# Patient Record
Sex: Male | Born: 1960 | Race: White | Hispanic: No | Marital: Married | Smoking: Never smoker
Health system: Southern US, Community
[De-identification: ages and names within clinical notes are randomized; demographics above are authoritative.]

## PROBLEM LIST (undated history)

## (undated) DIAGNOSIS — D649 Anemia, unspecified: Secondary | ICD-10-CM

## (undated) DIAGNOSIS — G8929 Other chronic pain: Secondary | ICD-10-CM

## (undated) DIAGNOSIS — J189 Pneumonia, unspecified organism: Secondary | ICD-10-CM

## (undated) DIAGNOSIS — T7840XA Allergy, unspecified, initial encounter: Secondary | ICD-10-CM

## (undated) DIAGNOSIS — E785 Hyperlipidemia, unspecified: Secondary | ICD-10-CM

## (undated) DIAGNOSIS — K219 Gastro-esophageal reflux disease without esophagitis: Secondary | ICD-10-CM

## (undated) HISTORY — DX: Pneumonia, unspecified organism: J18.9

## (undated) HISTORY — DX: Allergy, unspecified, initial encounter: T78.40XA

## (undated) HISTORY — DX: Gastro-esophageal reflux disease without esophagitis: K21.9

## (undated) HISTORY — DX: Hyperlipidemia, unspecified: E78.5

## (undated) HISTORY — DX: Anemia, unspecified: D64.9

## (undated) HISTORY — DX: Other chronic pain: G89.29

## (undated) HISTORY — PX: EYE SURGERY: SHX253

---

## 1993-10-14 HISTORY — PX: SHOULDER SURGERY: SHX246

## 2008-10-14 HISTORY — PX: APPENDECTOMY: SHX54

## 2010-10-14 DIAGNOSIS — J189 Pneumonia, unspecified organism: Secondary | ICD-10-CM

## 2010-10-14 HISTORY — DX: Pneumonia, unspecified organism: J18.9

## 2012-10-12 ENCOUNTER — Ambulatory Visit: Payer: Self-pay | Admitting: Family Medicine

## 2012-10-21 ENCOUNTER — Encounter: Payer: Self-pay | Admitting: Family Medicine

## 2012-10-21 ENCOUNTER — Ambulatory Visit (INDEPENDENT_AMBULATORY_CARE_PROVIDER_SITE_OTHER): Payer: 59 | Admitting: Family Medicine

## 2012-10-21 VITALS — BP 118/82 | HR 72 | Temp 98.1°F | Ht 65.0 in | Wt 162.2 lb

## 2012-10-21 DIAGNOSIS — G2581 Restless legs syndrome: Secondary | ICD-10-CM | POA: Insufficient documentation

## 2012-10-21 DIAGNOSIS — A6000 Herpesviral infection of urogenital system, unspecified: Secondary | ICD-10-CM | POA: Insufficient documentation

## 2012-10-21 DIAGNOSIS — M2550 Pain in unspecified joint: Secondary | ICD-10-CM

## 2012-10-21 DIAGNOSIS — R140 Abdominal distension (gaseous): Secondary | ICD-10-CM | POA: Insufficient documentation

## 2012-10-21 DIAGNOSIS — D509 Iron deficiency anemia, unspecified: Secondary | ICD-10-CM

## 2012-10-21 DIAGNOSIS — R143 Flatulence: Secondary | ICD-10-CM

## 2012-10-21 LAB — CBC WITH DIFFERENTIAL/PLATELET
Eosinophils Absolute: 0.1 10*3/uL (ref 0.0–0.7)
Eosinophils Relative: 2 % (ref 0–5)
Hemoglobin: 15.4 g/dL (ref 13.0–17.0)
Lymphs Abs: 1.6 10*3/uL (ref 0.7–4.0)
MCH: 27.4 pg (ref 26.0–34.0)
MCV: 80.1 fL (ref 78.0–100.0)
Monocytes Relative: 10 % (ref 3–12)
RBC: 5.62 MIL/uL (ref 4.22–5.81)

## 2012-10-21 LAB — VITAMIN B12: Vitamin B-12: 345 pg/mL (ref 211–911)

## 2012-10-21 LAB — COMPREHENSIVE METABOLIC PANEL
CO2: 25 mEq/L (ref 19–32)
Creat: 0.91 mg/dL (ref 0.50–1.35)
Glucose, Bld: 99 mg/dL (ref 70–99)
Sodium: 139 mEq/L (ref 135–145)
Total Bilirubin: 0.4 mg/dL (ref 0.3–1.2)
Total Protein: 6.8 g/dL (ref 6.0–8.3)

## 2012-10-21 LAB — POCT SEDIMENTATION RATE: POCT SED RATE: 5 mm/hr (ref 0–22)

## 2012-10-21 LAB — TSH: TSH: 2.131 u[IU]/mL (ref 0.350–4.500)

## 2012-10-21 MED ORDER — CITALOPRAM HYDROBROMIDE 40 MG PO TABS: 40.0000 mg | ORAL_TABLET | Freq: Every day | ORAL | Status: DC

## 2012-10-21 MED ORDER — VALACYCLOVIR HCL 500 MG PO TABS: 500.0000 mg | ORAL_TABLET | Freq: Two times a day (BID) | ORAL | Status: DC | PRN

## 2012-10-21 MED ORDER — PANTOPRAZOLE SODIUM 40 MG PO TBEC: 40.0000 mg | DELAYED_RELEASE_TABLET | Freq: Every day | ORAL | Status: DC | PRN

## 2012-10-21 NOTE — Assessment & Plan Note (Signed)
Will recheck CBC. Reports negative hemoccult done at outside facility and negative colonoscopy. He has a family hx of colon cancer in mother. If abdominal symptoms persist with microcytic anemia, may refer to GI for repeat colonoscopy or EGD.

## 2012-10-21 NOTE — Progress Notes (Signed)
Subjective:    Patient ID: Perry Rodriguez, male    DOB: August 10, 1961, 52 y.o.   MRN: [id number]  HPI New patient to establish care. Previous at [facility], recently moved from [facility] (there for 8 yrs)  1. Anemia. States was found on lab testing, mild. Brings report showing microcytic 73 with hgb 11.7. He underwent stool testing negative, and was told to start iron supplements. He stopped this 2 weeks ago, seems upset regarding not getting additional testing done.  Reports he had a normal colonoscopy in [place] in 2011, never had EGD. ROS complains of myalgias, hip/elbow/wrist pain bilaterally, fatigue, abdominal pain and bloating x 3 months.  2. Restless leg syndrome. Diagnosed with sleep study x 2. He doesn't sleep well and feels tired frequently. Has been taking clonazepam for many years and celexa. Requests refills. Denies dyspnea, cough, leg swelling, falls, numbness, rash.  Past Medical History  Diagnosis Date  . Allergy   . Anemia   . Chronic pain   . GERD (gastroesophageal reflux disease)   . Hyperlipidemia    Past Surgical History  Procedure Date  . Appendectomy 2010  . Shoulder surgery 1995    rotator cuff  . Eye surgery     cataract   History   Social History  . Marital Status: Married    Spouse Name: N/A    Number of Children: N/A  . Years of Education: N/A   Occupational History  . Not on file.   Social History Main Topics  . Smoking status: Never Smoker   . Smokeless tobacco: Not on file  . Alcohol Use: Yes     Comment: 2 daily  . Drug Use: No  . Sexually Active:    Other Topics Concern  . Not on file   Social History Narrative   Works in office. Married 28 yrs. One son. Walks for exercise.   Family History  Problem Relation Age of Onset  . Cancer Mother     cancer  . Heart disease Father   . Hyperlipidemia Father   . Hypertension Father   . Hyperlipidemia Brother   . Alcohol abuse Paternal Uncle   . Alcohol abuse Paternal Grandfather      Review of Systems All other ROS negative except for chronic sight and hearing issues, hoarseness. Patient states his most concerning issues are listed above and he agrees to defer these until later date.    Objective:   Physical Exam  Vitals reviewed. Constitutional: He is oriented to person, place, and time. He appears well-developed and well-nourished. No distress.       Well-appearing.  HENT:  Head: Normocephalic and atraumatic.  Right Ear: External ear normal.  Left Ear: External ear normal.  Nose: Nose normal.  Mouth/Throat: Oropharynx is clear and moist. No oropharyngeal exudate.       No sinus TTP  Eyes: EOM are normal. Pupils are equal, round, and reactive to light.  Neck: Normal range of motion. Neck supple. No thyromegaly present.  Cardiovascular: Normal rate, regular rhythm and normal heart sounds.   No murmur heard. Pulmonary/Chest: Effort normal and breath sounds normal. No respiratory distress. He has no wheezes. He has no rales.  Abdominal: Soft. Bowel sounds are normal. He exhibits no distension. There is no tenderness. There is no rebound and no guarding.  Musculoskeletal: He exhibits no edema and no tenderness.  Lymphadenopathy:    He has no cervical adenopathy.  Neurological: He is alert and oriented to person, place, and time.  No cranial nerve deficit. He exhibits normal muscle tone. Coordination normal.  Skin: No rash noted. He is not diaphoretic.  Psychiatric: He has a normal mood and affect. His behavior is normal. Thought content normal.       Assessment & Plan:

## 2012-10-21 NOTE — Patient Instructions (Addendum)
Nice to meet you. I will send refills. Check labs. We can discuss at next visit the results. Make appointment for clonazepam refills and to discuss results.

## 2012-10-21 NOTE — Assessment & Plan Note (Signed)
Refill to use prn. Consider change to daily prophylactic dose if recurrent flares.

## 2012-10-21 NOTE — Assessment & Plan Note (Signed)
Refill celexa. Advised pt to return for clonopin, no controlled substances at first office visit.

## 2012-10-21 NOTE — Assessment & Plan Note (Signed)
Exam appears benign today and he is overall well-appearing and young. With symmetric and polyarticular complaints with myalgias and fatigue, will check labs, ESR to eval for inflammatory condition. If negative, this may represent more of a fibromyalgia or chronic pain syndrome. F/u in 2 weeks.

## 2012-10-22 ENCOUNTER — Encounter: Payer: Self-pay | Admitting: Family Medicine

## 2012-11-04 ENCOUNTER — Ambulatory Visit (INDEPENDENT_AMBULATORY_CARE_PROVIDER_SITE_OTHER): Payer: 59 | Admitting: Family Medicine

## 2012-11-04 ENCOUNTER — Encounter: Payer: Self-pay | Admitting: Family Medicine

## 2012-11-04 VITALS — BP 118/73 | HR 69 | Ht 65.0 in | Wt 160.0 lb

## 2012-11-04 DIAGNOSIS — G2581 Restless legs syndrome: Secondary | ICD-10-CM

## 2012-11-04 DIAGNOSIS — N529 Male erectile dysfunction, unspecified: Secondary | ICD-10-CM | POA: Insufficient documentation

## 2012-11-04 DIAGNOSIS — R5381 Other malaise: Secondary | ICD-10-CM

## 2012-11-04 DIAGNOSIS — D509 Iron deficiency anemia, unspecified: Secondary | ICD-10-CM

## 2012-11-04 DIAGNOSIS — R5383 Other fatigue: Secondary | ICD-10-CM | POA: Insufficient documentation

## 2012-11-04 MED ORDER — CLONAZEPAM 0.5 MG PO TABS: 0.5000 mg | ORAL_TABLET | Freq: Two times a day (BID) | ORAL | Status: DC | PRN

## 2012-11-04 MED ORDER — SILDENAFIL CITRATE 100 MG PO TABS: 50.0000 mg | ORAL_TABLET | Freq: Every day | ORAL | Status: DC | PRN

## 2012-11-04 NOTE — Assessment & Plan Note (Signed)
Suspect chronic fatigue syndrome or result of restless legs/disordered sleeping. Labs normal including CBC, TSH, ESR. Normal colonoscopy reported. Advised lifestyle changes-avoiding alcohol, increased regular exercise for better sleep hygeine. Discussed trying to go off clonopin which can disrupt later phases of sleep, but he is not willing because has shown to make restless legs unbearable. Advised to f/u in 6 months for repeat CBC.

## 2012-11-04 NOTE — Assessment & Plan Note (Signed)
Seems controlled on his regimen celexa and klonopin. Refilled this today, discussed trial off benzo, but he has failed requip and other drugs previously. F/u in 6 months.

## 2012-11-04 NOTE — Assessment & Plan Note (Signed)
By the way request to try viagra, discussed possible side effects and to start with low dose.

## 2012-11-04 NOTE — Progress Notes (Signed)
  Subjective:    Patient ID: Perry Rodriguez, male    DOB: [birth year], 52 y.o.   MRN: [id number]  HPI  1. F/u fatigue. Reviewed labs wnl. He describes fatigue upon awakening in the morning, needs to take a one hour nap then feels fine after that. Has restless leg syndrome and 2 sleep studies from OSH negative for OSA. He snores and uses mandibular advancement device. He does drink 2-3 alcoholic drinks nightly, and is dependent on clonopin for RLS. He doesn't exercise regularly. Denies dyspnea, cough, weight loss, rash, blood in stool or urine.  2. History of anemia. Blood counts wnl currently. He had a borderline low hemoglobin with negative colonoscopy and negative hemacult before coming to clinic. He has not been taking iron, denies palpitations, syncope, gross blood loss.  3. Restless leg syndrome. Needs refill clonopin, been on this for 10 years. Has failed requip and 2 other medications before he moved here.   Review of Systems See HPI otherwise negative.  reports that he has never smoked. He does not have any smokeless tobacco history on file.     Objective:   Physical Exam  Vitals reviewed. Constitutional: He is oriented to person, place, and time. He appears well-developed and well-nourished. No distress.  HENT:  Head: Normocephalic and atraumatic.  Right Ear: External ear normal.  Left Ear: External ear normal.  Nose: Nose normal.  Mouth/Throat: Oropharynx is clear and moist. No oropharyngeal exudate.  Eyes: EOM are normal. Pupils are equal, round, and reactive to light.  Cardiovascular: Normal rate, regular rhythm and normal heart sounds.   No murmur heard. Pulmonary/Chest: Effort normal and breath sounds normal. No respiratory distress. He has no wheezes.  Neurological: He is alert and oriented to person, place, and time. No cranial nerve deficit. He exhibits normal muscle tone. Coordination normal.  Psychiatric: He has a normal mood and affect. His behavior is normal.           Assessment & Plan:

## 2012-11-04 NOTE — Assessment & Plan Note (Signed)
Resolved and without iron currently. Negative stool guiac, negative colonoscopy. Advised to f/u in 6 months for repeat check  And consider EGD if returns.

## 2012-11-04 NOTE — Patient Instructions (Signed)
Your labs look reassuring for nothing bad causing your fatigue. Suspect it has to do with disordered sleeping pattern. You could try avoiding alcohol and doing physical exertion before 7:00 daily. I have refilled klonopin. Make appointment for check up in 6 months for blood checks.

## 2012-11-17 ENCOUNTER — Telehealth: Payer: Self-pay | Admitting: Family Medicine

## 2012-11-17 DIAGNOSIS — E611 Iron deficiency: Secondary | ICD-10-CM

## 2012-11-17 NOTE — Telephone Encounter (Signed)
Dr Konkol 

## 2012-11-17 NOTE — Telephone Encounter (Signed)
Dr Newman Nip would like an Iron panel lab drawn to compare to her September results that were done in a different Dr's office.She states she has given [place] a copy of the September results and should be in her chart.please advise. Perry Rodriguez, Perry Rodriguez

## 2012-11-17 NOTE — Telephone Encounter (Signed)
Pt is asking to speak to nurse about his lab work - has questions in relation to some previous labs compared to this recent lab work.

## 2012-11-20 NOTE — Telephone Encounter (Signed)
Pt would like to know something today, please

## 2012-11-20 NOTE — Telephone Encounter (Signed)
Called patient back. Iron levels not indicated with normal hemoglobin, but he is curious. I agree to order this and he can make lab appointment.

## 2012-11-27 ENCOUNTER — Other Ambulatory Visit: Payer: 59

## 2012-12-01 ENCOUNTER — Other Ambulatory Visit: Payer: 59

## 2012-12-01 DIAGNOSIS — E611 Iron deficiency: Secondary | ICD-10-CM

## 2012-12-01 LAB — FERRITIN: Ferritin: 27 ng/mL (ref 22–322)

## 2012-12-01 NOTE — Progress Notes (Signed)
IBC AND FERRITIN DONE TODAY Perry Rodriguez

## 2012-12-02 ENCOUNTER — Telehealth: Payer: Self-pay | Admitting: Family Medicine

## 2012-12-02 ENCOUNTER — Encounter: Payer: Self-pay | Admitting: Family Medicine

## 2012-12-02 NOTE — Telephone Encounter (Signed)
Patient is calling because his upper abdomen going around to his back has been going on for a few weeks.  He would like to know if she has any recommendations.

## 2012-12-25 ENCOUNTER — Other Ambulatory Visit: Payer: Self-pay | Admitting: Family Medicine

## 2012-12-28 NOTE — Telephone Encounter (Signed)
rx caled in,spoke with Hospital doctor at [facility]. Perry Rodriguez, Perry Rodriguez

## 2013-02-26 ENCOUNTER — Other Ambulatory Visit: Payer: Self-pay | Admitting: Family Medicine

## 2013-03-01 ENCOUNTER — Other Ambulatory Visit: Payer: Self-pay | Admitting: Family Medicine

## 2013-03-02 NOTE — Telephone Encounter (Signed)
Rx for Klonopin called into [facility] per MD request.  Radene Ou

## 2013-03-12 ENCOUNTER — Ambulatory Visit: Payer: 59 | Admitting: Family Medicine

## 2013-04-20 ENCOUNTER — Ambulatory Visit (INDEPENDENT_AMBULATORY_CARE_PROVIDER_SITE_OTHER): Payer: 59 | Admitting: Family Medicine

## 2013-04-20 ENCOUNTER — Encounter: Payer: Self-pay | Admitting: Family Medicine

## 2013-04-20 VITALS — BP 108/80 | HR 66 | Temp 98.4°F | Ht 65.0 in | Wt 156.0 lb

## 2013-04-20 DIAGNOSIS — G2581 Restless legs syndrome: Secondary | ICD-10-CM

## 2013-04-20 DIAGNOSIS — D509 Iron deficiency anemia, unspecified: Secondary | ICD-10-CM

## 2013-04-20 DIAGNOSIS — R5383 Other fatigue: Secondary | ICD-10-CM

## 2013-04-20 DIAGNOSIS — N529 Male erectile dysfunction, unspecified: Secondary | ICD-10-CM

## 2013-04-20 LAB — CBC WITH DIFFERENTIAL/PLATELET
Basophils Relative: 0.7 % (ref 0.0–3.0)
Eosinophils Absolute: 0.3 10*3/uL (ref 0.0–0.7)
HCT: 42.8 % (ref 39.0–52.0)
Hemoglobin: 14.7 g/dL (ref 13.0–17.0)
Lymphocytes Relative: 30.8 % (ref 12.0–46.0)
Lymphs Abs: 1.5 10*3/uL (ref 0.7–4.0)
MCHC: 34.3 g/dL (ref 30.0–36.0)
MCV: 90.5 fl (ref 78.0–100.0)
Monocytes Absolute: 0.4 10*3/uL (ref 0.1–1.0)
Neutro Abs: 2.5 10*3/uL (ref 1.4–7.7)
RBC: 4.73 Mil/uL (ref 4.22–5.81)

## 2013-04-20 LAB — BASIC METABOLIC PANEL
BUN: 10 mg/dL (ref 6–23)
Chloride: 105 mEq/L (ref 96–112)
Creatinine, Ser: 1 mg/dL (ref 0.4–1.5)

## 2013-04-20 LAB — TSH: TSH: 2.3 u[IU]/mL (ref 0.35–5.50)

## 2013-04-20 MED ORDER — DULOXETINE HCL 30 MG PO CPEP: 30.0000 mg | ORAL_CAPSULE | Freq: Every day | ORAL | Status: DC

## 2013-04-20 MED ORDER — TADALAFIL 20 MG PO TABS: 20.0000 mg | ORAL_TABLET | Freq: Every day | ORAL | Status: AC | PRN

## 2013-04-20 NOTE — Assessment & Plan Note (Signed)
New to provider, ongoing for pt.  sxs manageable w/ klonopin.  Will continue to follow and refill meds prn.

## 2013-04-20 NOTE — Assessment & Plan Note (Signed)
New to provider, dx'd Sept 2013.  Pt was taking iron regularly up until 2 weeks ago.  Still w/ fatigue but less so than previously.  Repeat labs to determine if pt remains anemic.  If so, will refer to hematology due to Mediterranean heritage and possible thalassemia.  Pt expressed understanding and is in agreement w/ plan.

## 2013-04-20 NOTE — Patient Instructions (Addendum)
We'll notify you of your lab results and make any changes if needed STOP the citalopram START the Cymbalta daily Call with any questions or concerns Happy Early Birthday!! Welcome!  We're glad to have you!

## 2013-04-20 NOTE — Progress Notes (Signed)
  Subjective:    Patient ID: Perry Rodriguez, male    DOB: 1961-07-29, 52 y.o.   MRN: [id number]  HPI New to establish.  Previous MD- Dalia Heading  Anemia- pt w/ hgb of 11.7 in Sept.  Started on iron and by Jan hgb was 15.4.  Iron levels had also improved.  Work up was initiated due to pt's complaint of fatigue.  Had normal thyroid studies in both Sept and Jan.  Not currently on iron (stopped June 30th).  Had normal GI w/u w/ normal colonoscopy.  Fatigue- pt reports this has somewhat improved since the fall.  Sleeping better.  On Klonopin for severe RLS.  Has been on Mirapex and Requip w/out success.  Had 2 sleep studies- wearing a mouth piece nightly.  + ED, decreased drive.  Decreased stamina w/ SOB- had pulm work up previously.  Not exercising regularly.  Anxiety/depression- has been on med for 'irritability' for '20 yrs'.  Currently on Celexa.  Herpes- has had 4-5 outbreaks in last 6 months where previously this was ~1x/yr.  Currently on Valtrex PRN.   Review of Systems For ROS see HPI     Objective:   Physical Exam  Vitals reviewed. Constitutional: He is oriented to person, place, and time. He appears well-developed and well-nourished. No distress.  HENT:  Head: Normocephalic and atraumatic.  Eyes: Conjunctivae and EOM are normal. Pupils are equal, round, and reactive to light.  Neck: Normal range of motion. Neck supple. No thyromegaly present.  Cardiovascular: Normal rate, regular rhythm, normal heart sounds and intact distal pulses.   No murmur heard. Pulmonary/Chest: Effort normal and breath sounds normal. No respiratory distress.  Abdominal: Soft. Bowel sounds are normal. He exhibits no distension.  Musculoskeletal: He exhibits no edema.  Lymphadenopathy:    He has no cervical adenopathy.  Neurological: He is alert and oriented to person, place, and time. No cranial nerve deficit.  Skin: Skin is warm and dry.  Psychiatric: He has a normal mood and affect. His behavior is  normal.          Assessment & Plan:

## 2013-04-20 NOTE — Assessment & Plan Note (Signed)
New to provider, ongoing for pt.  sxs are not as severe as previous.  Will check labs to r/o anemia, thyroid disorder, low T (pt also w/ ED and decreased libido).  Will also switch SSRI to SNRI due to fact that this is more activating.  Will follow and monitor for improvement.

## 2013-04-20 NOTE — Assessment & Plan Note (Signed)
Check testosterone level and script given for Cialis.

## 2013-04-21 ENCOUNTER — Encounter: Payer: Self-pay | Admitting: *Deleted

## 2013-04-21 LAB — TESTOSTERONE, FREE, TOTAL, SHBG
Testosterone-% Free: 1.8 % (ref 1.6–2.9)
Testosterone: 636 ng/dL (ref 300–890)

## 2013-05-13 ENCOUNTER — Telehealth: Payer: Self-pay | Admitting: Family Medicine

## 2013-05-13 DIAGNOSIS — R5381 Other malaise: Secondary | ICD-10-CM

## 2013-05-13 NOTE — Telephone Encounter (Signed)
Patient left vm on triage line stating he has questions regarding his last visit with Dr. Beverely Low. He was seen for several different issues. CB# [phone]

## 2013-05-14 NOTE — Telephone Encounter (Signed)
Patient states that it is mostly fatigue. Has body aches but not as worried about that since he feels it could be from normal moving around.

## 2013-05-14 NOTE — Telephone Encounter (Signed)
Referral placed. Advised patient.

## 2013-05-14 NOTE — Telephone Encounter (Signed)
I returned patient's call and he states that he is still feeling tired and he is not getting any better. Please Advise

## 2013-05-14 NOTE — Telephone Encounter (Signed)
Is pt having any other sxs other than fatigue?  Body aches, joint pains, etc.  He will need a referral but I'm trying to determine if it will be Rheum or Endo

## 2013-05-14 NOTE — Telephone Encounter (Signed)
Please refer to endo for chronic fatigue

## 2013-05-19 ENCOUNTER — Ambulatory Visit (INDEPENDENT_AMBULATORY_CARE_PROVIDER_SITE_OTHER): Payer: 59 | Admitting: Endocrinology

## 2013-05-19 ENCOUNTER — Encounter: Payer: Self-pay | Admitting: Endocrinology

## 2013-05-19 VITALS — BP 124/64 | HR 61 | Temp 97.6°F | Resp 12 | Ht 65.0 in | Wt 156.4 lb

## 2013-05-19 DIAGNOSIS — R0989 Other specified symptoms and signs involving the circulatory and respiratory systems: Secondary | ICD-10-CM

## 2013-05-19 DIAGNOSIS — R5381 Other malaise: Secondary | ICD-10-CM

## 2013-05-19 DIAGNOSIS — G4710 Hypersomnia, unspecified: Secondary | ICD-10-CM

## 2013-05-19 DIAGNOSIS — R5383 Other fatigue: Secondary | ICD-10-CM

## 2013-05-19 DIAGNOSIS — R400 Somnolence: Secondary | ICD-10-CM

## 2013-05-19 LAB — T4, FREE: Free T4: 0.69 ng/dL (ref 0.60–1.60)

## 2013-05-19 NOTE — Progress Notes (Signed)
Patient ID: Perry Rodriguez, male   DOB: 1961/01/02, 52 y.o.   MRN: [id number]   REASON for REFERRAL: Fatigue  History of Present Illness:  The patient has a history of fatigue off and on for the last several years  He says that symptom does not occur everyday but he gets significantly fatigued and sleepy during the day. This may occur sometimes even after a full nights sleep Associated with this fatigue is feeling of not being able to think clearly or concentrate. At that time he feels his extremities are as heavy as lead When he has the symptoms he will feel bad the whole day He may occasionally need a nap about twice a week because of these symptoms He does not think he has any lightheadedness or faintness with these symptoms He does exercise periodically and he finds that his muscle strength is good. He generally walks 1 mile without problem  He denies any symptoms of cold intolerance, dry skin or constipation  The patient has had a previous history of sleep disorder for about 10 years. He has been studied twice and has been found to have poor sleep along with restless legs He has a history of snoring but he was told that he has no sleep apnea  The patient has had thyroid levels done and have been normal and he is here for further evaluation  Past Medical History  Diagnosis Date  . Allergy   . Anemia   . Chronic pain   . GERD (gastroesophageal reflux disease)   . Hyperlipidemia     Past Surgical History  Procedure Laterality Date  . Appendectomy  2010  . Shoulder surgery  1995    rotator cuff  . Eye surgery      cataract    Family History  Problem Relation Age of Onset  . Cancer Mother     cancer  . Heart disease Father   . Hyperlipidemia Father   . Hypertension Father   . Hyperlipidemia Brother   . Alcohol abuse Paternal Uncle   . Alcohol abuse Paternal Grandfather     Social History:  reports that he has never smoked. He does not have any smokeless tobacco  history on file. He reports that  drinks alcohol. He reports that he does not use illicit drugs.  Allergies: No Known Allergies    Medication List       This list is accurate as of: 05/19/13  1:12 PM.  Always use your most recent med list.               clonazePAM 0.5 MG tablet  Commonly known as:  KLONOPIN  TAKE 1 TABLET BY MOUTH TWICE DAILY AS NEEDED FOR RESTLESS LEGS     DULoxetine 30 MG capsule  Commonly known as:  CYMBALTA  Take 1 capsule (30 mg total) by mouth daily.     pantoprazole 40 MG tablet  Commonly known as:  PROTONIX  Take 1 tablet (40 mg total) by mouth daily as needed.     SLOW RELEASE IRON 45 MG Tbcr  Generic drug:  Ferrous Sulfate Dried  Take 1 tablet by mouth daily.     tadalafil 20 MG tablet  Commonly known as:  CIALIS  Take 1 tablet (20 mg total) by mouth daily as needed for erectile dysfunction.     valACYclovir 500 MG tablet  Commonly known as:  VALTREX  Take 1 tablet (500 mg total) by mouth 2 (two) times daily as needed.  No visits with results within 1 Week(s) from this visit. Latest known visit with results is:  Office Visit on 04/20/2013  Component Date Value Range Status  . WBC 04/20/2013 4.7  4.5 - 10.5 K/uL Final  . RBC 04/20/2013 4.73  4.22 - 5.81 Mil/uL Final  . Hemoglobin 04/20/2013 14.7  13.0 - 17.0 g/dL Final  . HCT 16/07/9603 42.8  39.0 - 52.0 % Final  . MCV 04/20/2013 90.5  78.0 - 100.0 fl Final  . MCHC 04/20/2013 34.3  30.0 - 36.0 g/dL Final  . RDW 54/06/8118 13.5  11.5 - 14.6 % Final  . Platelets 04/20/2013 232.0  150.0 - 400.0 K/uL Final  . Neutrophils Relative % 04/20/2013 52.9  43.0 - 77.0 % Final  . Lymphocytes Relative 04/20/2013 30.8  12.0 - 46.0 % Final  . Monocytes Relative 04/20/2013 8.6  3.0 - 12.0 % Final  . Eosinophils Relative 04/20/2013 7.0* 0.0 - 5.0 % Final  . Basophils Relative 04/20/2013 0.7  0.0 - 3.0 % Final  . Neutro Abs 04/20/2013 2.5  1.4 - 7.7 K/uL Final  . Lymphs Abs 04/20/2013 1.5  0.7 -  4.0 K/uL Final  . Monocytes Absolute 04/20/2013 0.4  0.1 - 1.0 K/uL Final  . Eosinophils Absolute 04/20/2013 0.3  0.0 - 0.7 K/uL Final  . Basophils Absolute 04/20/2013 0.0  0.0 - 0.1 K/uL Final  . Sodium 04/20/2013 137  135 - 145 mEq/L Final  . Potassium 04/20/2013 4.2  3.5 - 5.1 mEq/L Final  . Chloride 04/20/2013 105  96 - 112 mEq/L Final  . CO2 04/20/2013 30  19 - 32 mEq/L Final  . Glucose, Bld 04/20/2013 97  70 - 99 mg/dL Final  . BUN 14/78/2956 10  6 - 23 mg/dL Final  . Creatinine, Ser 04/20/2013 1.0  0.4 - 1.5 mg/dL Final  . Calcium 21/30/8657 8.8  8.4 - 10.5 mg/dL Final  . GFR 84/69/6295 87.12  >60.00 mL/min Final  . TSH 04/20/2013 2.30  0.35 - 5.50 uIU/mL Final  . Iron 04/20/2013 62  42 - 165 ug/dL Final  . Transferrin 28/41/3244 303.3  212.0 - 360.0 mg/dL Final  . Saturation Ratios 04/20/2013 14.6* 20.0 - 50.0 % Final  . Testosterone 04/20/2013 636  300 - 890 ng/dL Final   Comment:           Tanner Stage       Male              Male                                        I              < 30 ng/dL        < 10 ng/dL                                        II             < 150 ng/dL       < 30 ng/dL                                        III  100-320 ng/dL     < 35 ng/dL                                        IV             200-970 ng/dL     16-10 ng/dL                                        V/Adult        300-890 ng/dL     96-04 ng/dL                             . Sex Hormone Binding 04/20/2013 44  13 - 71 nmol/L Final  . Testosterone, Free 04/20/2013 115.9  47.0 - 244.0 pg/mL Final   Comment:                            The concentration of free testosterone is derived from a mathematical                          expression based on constants for the binding of testosterone to sex                          hormone-binding globulin and albumin.  . Testosterone-% Freee. 04/20/2013 1.8  1.6 - 2.9 % Final     REVIEW OF SYSTEMS:           No unusual headaches.     He  has not had any significant weight change, he has normal appetite.       Skin: No rash or infections     Thyroid:  He has had long-standing sensitivity to cold which is not any worse     His blood pressure has been normal.     No swelling of feet.    He may get short of breath on moderate exertion     Bowel habits:  No change. He does have a history of gastroesophageal reflux         No frequency of urination       He. had mild joint pains which are generalized and has a feeling of stiffness especially when he is sitting for sometime       He has had a history of depression  and anxiety. He was started on Celexa when he was having symptoms of irritability and moodiness   PHYSICAL EXAM:  BP 124/64  Pulse 61  Temp(Src) 97.6 F (36.4 C)  Resp 12  Ht 5\' 5"  (1.651 m)  Wt 156 lb 6.4 oz (70.943 kg)  BMI 26.03 kg/m2  SpO2 98%  GENERAL:  Average built and nourished. Normal facial appearance  No pallor, clubbing, lymphadenopathy or edema.  Skin:  no rash or pigmentation.  EYES:  Externally normal.   ENT: Oral mucosa and tongue normal.  THYROID:  Not palpable.  HEART:  Normal  S1 and S2; no murmur or click.  CHEST:  Normal shape and expansion.  Lungs:  Percussion normal.  Vescicular breath sounds heard equally.  No crepitations/ wheeze.  ABDOMEN:  No distention.  Liver and spleen  not palpable.  No other mass or tenderness.  NEUROLOGICAL:.Reflexes are  normal bilaterally at  biceps.   JOINTS:  Normal appearance.   ASSESSMENT:   FATIGUE: He has intermittent fatigue and this is more in the form of somnolence at times and lack of energy He does have known sleep disorder with restless legs and frequent awakenings and this is likely to be the cause of his fatigue He has had normal labs recently including testosterone levels and TSH  Overall his symptoms do not fit with any typical endocrine deficiency disorder but will need to rule out hypopituitarism especially secondary  hypothyroidism since he has some difficulty concentration  and cold intolerance  He does have erectile dysfunction of unclear etiology but his testosterone level is quite normal He does have history of anxiety/mood disorder but this appears to be fairly well controlled  He has no evidence of Addison's disease with no typical symptoms, orthostatic hypotension or electrolyte abnormality  PLAN:   Will check free T4 and IGF-1 level today  If the tests are normal would suggest that he see a sleep disorder specialist for further management  He can also discuss his symptom of dyspnea on exertion with PCP  [facility] 05/19/2013, 1:12 PM   Addendum: Free T4 and IGF-1 normal

## 2013-05-19 NOTE — Patient Instructions (Addendum)
Your labs wil be available in 3-4 days

## 2013-05-20 LAB — INSULIN-LIKE GROWTH FACTOR: Somatomedin (IGF-I): 116 ng/mL (ref 55–213)

## 2013-05-21 NOTE — Progress Notes (Signed)
Quick Note:  Please let patient know that the lab results are normal and no further action needed ______ 

## 2013-05-24 ENCOUNTER — Telehealth: Payer: Self-pay | Admitting: *Deleted

## 2013-05-24 NOTE — Telephone Encounter (Signed)
Message copied by Hermenia Bers on Mon May 24, 2013  3:54 PM ------      Message from: Perry Rodriguez      Created: Fri May 21, 2013  9:30 AM       Please let patient know that the lab results are normal and no further action needed ------

## 2013-05-24 NOTE — Telephone Encounter (Signed)
Left message for patient to return call.

## 2013-05-25 ENCOUNTER — Telehealth: Payer: Self-pay | Admitting: *Deleted

## 2013-05-25 NOTE — Telephone Encounter (Signed)
Pt is aware of lab results.

## 2013-05-25 NOTE — Telephone Encounter (Signed)
Message copied by Hermenia Bers on Tue May 25, 2013  2:04 PM ------      Message from: Reather Littler      Created: Fri May 21, 2013  9:30 AM       Please let patient know that the lab results are normal and no further action needed ------

## 2013-06-07 ENCOUNTER — Other Ambulatory Visit: Payer: Self-pay | Admitting: Family Medicine

## 2013-06-09 ENCOUNTER — Other Ambulatory Visit: Payer: Self-pay | Admitting: Family Medicine

## 2013-06-09 NOTE — Telephone Encounter (Signed)
No UDS on file.  Last filled on 03/01/2013 Last OV on 04/20/2013  Please advise. SW, CMA

## 2013-06-10 NOTE — Telephone Encounter (Signed)
Ok for #60 but needs controlled substance agreement and UDS if not done

## 2013-06-11 NOTE — Telephone Encounter (Signed)
Med filled. Placed up front for pick up, called and notified pt. Needs UDS.

## 2013-06-29 ENCOUNTER — Telehealth: Payer: Self-pay | Admitting: General Practice

## 2013-06-29 ENCOUNTER — Other Ambulatory Visit: Payer: Self-pay | Admitting: General Practice

## 2013-06-29 MED ORDER — PANTOPRAZOLE SODIUM 40 MG PO TBEC: 40.0000 mg | DELAYED_RELEASE_TABLET | Freq: Every day | ORAL | Status: DC | PRN

## 2013-06-29 MED ORDER — DULOXETINE HCL 30 MG PO CPEP: 30.0000 mg | ORAL_CAPSULE | Freq: Every day | ORAL | Status: DC

## 2013-06-29 MED ORDER — CLONAZEPAM 0.5 MG PO TABS: ORAL_TABLET | ORAL | Status: DC

## 2013-06-29 NOTE — Telephone Encounter (Signed)
Ok for #180 of Klonopin- no refills

## 2013-06-29 NOTE — Telephone Encounter (Signed)
Spoke with pt stated that he needs to have all [facility]. Reordered long-term medications already. Pt would like to know if clonopin can be sent into a mail order please advise. If not he would like a refill sent to [facility] on [address]

## 2013-06-29 NOTE — Telephone Encounter (Signed)
[facility]

## 2013-07-05 ENCOUNTER — Telehealth: Payer: Self-pay | Admitting: General Practice

## 2013-07-05 NOTE — Telephone Encounter (Signed)
Message on triage line: states he is having bad side effects to his cymbalta. Pt advised that he stopped medications and restarted his old medication. Could not understand what medication this is. Called pt back and LMOVM   Need to know what medication he began? What side effects was he having from the cymbalta?

## 2013-07-08 NOTE — Telephone Encounter (Signed)
Called pt again. Left message to return call °

## 2013-07-08 NOTE — Telephone Encounter (Signed)
Closing encounter until pt returns call.  

## 2013-07-20 ENCOUNTER — Telehealth: Payer: Self-pay | Admitting: *Deleted

## 2013-07-20 MED ORDER — CITALOPRAM HYDROBROMIDE 40 MG PO TABS: 40.0000 mg | ORAL_TABLET | Freq: Every day | ORAL | Status: DC

## 2013-07-20 NOTE — Telephone Encounter (Signed)
Ok to switch to Celexa 40mg  daily- remove Cymbalta from med list

## 2013-07-20 NOTE — Telephone Encounter (Signed)
30 day supply sent to local [facility]. Other refills sent to [facility] mail [facility].

## 2013-07-20 NOTE — Telephone Encounter (Signed)
Pt called and stated that he would like a Rx for celexa. Pt states that he was recently on Cymbalta and had real bad side effects to the medication . Pt then went back and started the Celexa 40mg . He stated that the celexa is working better for him than the cymbalta. Pt was prescribed the celexa from Lloyd Huger MD, which he doesn't see anymore. He would like to know if you could give him a Rx for that celexa. Pt states that he would like this done without coming in the office. Please advise.    SW, CMA

## 2013-07-27 ENCOUNTER — Telehealth: Payer: Self-pay | Admitting: Family Medicine

## 2013-07-27 NOTE — Telephone Encounter (Signed)
Spoke with pt and he would like to know if there was a recommended psychiatry provider that would be able to help him with depression. Sent him a list of providers that [facility] recommends.

## 2013-07-27 NOTE — Telephone Encounter (Signed)
Patient called and wanted to speak dr Beverely Low personally about a referral that he was wanting. Patient did not want to give me any additional information.

## 2013-08-03 ENCOUNTER — Telehealth: Payer: Self-pay | Admitting: Family Medicine

## 2013-08-03 DIAGNOSIS — R0602 Shortness of breath: Secondary | ICD-10-CM

## 2013-08-03 NOTE — Telephone Encounter (Signed)
Patient called and wanted to speak to a nurse about a specialist. Patient did not say what kind he just wanted to speak to a nurse. thanks

## 2013-08-04 NOTE — Telephone Encounter (Signed)
Spoke with pt and he would like to see if he could be referred to pulmonary for a sleep study. Would prefer to be seen by a provider in [facility]. States that he is still having issues with fatigue and SOB.

## 2013-08-05 NOTE — Telephone Encounter (Signed)
Ok for referral to pulmonary 

## 2013-08-05 NOTE — Telephone Encounter (Signed)
Referral placed.

## 2013-08-10 ENCOUNTER — Encounter: Payer: Self-pay | Admitting: Family Medicine

## 2013-08-11 ENCOUNTER — Institutional Professional Consult (permissible substitution): Payer: 59 | Admitting: Internal Medicine

## 2013-08-12 ENCOUNTER — Telehealth: Payer: Self-pay | Admitting: *Deleted

## 2013-08-12 NOTE — Telephone Encounter (Signed)
Patient called and wanted the status of his referral. Spoke with [place] and she is handling it. SW

## 2013-08-20 ENCOUNTER — Encounter: Payer: Self-pay | Admitting: Internal Medicine

## 2013-08-20 ENCOUNTER — Ambulatory Visit (INDEPENDENT_AMBULATORY_CARE_PROVIDER_SITE_OTHER)
Admission: RE | Admit: 2013-08-20 | Discharge: 2013-08-20 | Disposition: A | Discharge status: Home / Self Care | Payer: 59 | Source: Ambulatory Visit | Attending: Internal Medicine | Admitting: Internal Medicine

## 2013-08-20 ENCOUNTER — Ambulatory Visit (INDEPENDENT_AMBULATORY_CARE_PROVIDER_SITE_OTHER): Payer: 59 | Admitting: Internal Medicine

## 2013-08-20 VITALS — BP 112/74 | HR 60 | Temp 97.8°F | Ht 64.5 in | Wt 160.0 lb

## 2013-08-20 DIAGNOSIS — R0609 Other forms of dyspnea: Secondary | ICD-10-CM

## 2013-08-20 DIAGNOSIS — R0600 Dyspnea, unspecified: Secondary | ICD-10-CM

## 2013-08-20 NOTE — Assessment & Plan Note (Signed)
Symptoms are markedly disproportionate to objective findings and not clear this is a lung problem but pt does appear to have difficult airway management issues.  DDX of  difficult airways managment all start with A and  include Adherence, Ace Inhibitors, Acid Reflux, Active Sinus Disease, Alpha 1 Antitripsin deficiency, Anxiety masquerading as Airways dz,  ABPA,  allergy(esp in young), Aspiration (esp in elderly), Adverse effects of DPI,  Active smokers, plus two Bs  = Bronchiectasis and Beta blocker use..and one C= CHF  ? Acid (or non-acid) GERD > always difficult to exclude as up to 75% of pts in some series report no assoc GI/ Heartburn symptoms> rec max (24h)  acid suppression and diet restrictions/ reviewed and instructions given in writting  ? Anxiety/ depression/ deconditioning > needs reconditioning x 4 week then cpst.

## 2013-08-20 NOTE — Progress Notes (Signed)
  Subjective:    Patient ID: Perry Rodriguez, male    DOB: 05/31/1961   MRN: [id number]  HPI  35 yowm never smoker never great at aerobics even through HS eval in mid 90s by pulmonary doctor with nl lung function and referred 08/20/2013 by Dr Beverely Low for re-evaluation of doe and fatigue.  08/20/2013 1st [facility] office visit/ Wert cc  Chronic (all his life)  doe x one flight oob at top, can walk 2 miles but only run 50 yards,  Never at rest or sleeping. Also has chronic HB, takes protonix prn.  No obvious day to day or daytime variabilty or assoc chronic cough or cp or chest tightness, subjective wheeze overt sinus symptoms. No unusual exp hx or h/o childhood pna/ asthma or knowledge of premature birth.  Sleeping ok without nocturnal  or early am exacerbation  of respiratory  c/o's or need for noct saba. Also denies any obvious fluctuation of symptoms with weather or environmental changes or other aggravating or alleviating factors except as outlined above   Does have chronic fatigue and poor sleep hygiene x years with "two neg sleep studies x has restless legs" on clonazepam  Current Medications, Allergies, Complete Past Medical History, Past Surgical History, Family History, and Social History were reviewed in [employer] record.          Review of Systems  Constitutional: Negative for fever, chills, activity change, appetite change and unexpected weight change.  HENT: Positive for ear pain. Negative for congestion, dental problem, postnasal drip, rhinorrhea, sneezing, sore throat, trouble swallowing and voice change.   Eyes: Negative for visual disturbance.  Respiratory: Positive for shortness of breath. Negative for cough and choking.   Cardiovascular: Negative for chest pain and leg swelling.  Gastrointestinal: Negative for nausea, vomiting and abdominal pain.  Genitourinary: Negative for difficulty urinating.       Acid heartburn Indigestion   Musculoskeletal: Positive for arthralgias.  Skin: Negative for rash.  Psychiatric/Behavioral: Negative for behavioral problems and confusion.       Objective:   Physical Exam  Amb pleasant wm nad  Wt Readings from Last 3 Encounters:  08/20/13 160 lb (72.576 kg)  05/19/13 156 lb 6.4 oz (70.943 kg)  04/20/13 156 lb (70.761 kg)     HEENT: nl dentition, turbinates, and orophanx. Nl external ear canals without cough reflex   NECK :  without JVD/Nodes/TM/ nl carotid upstrokes bilaterally   LUNGS: no acc muscle use, clear to A and P bilaterally without cough on insp or exp maneuvers   CV:  RRR  no s3 or murmur or increase in P2, no edema   ABD:  soft and nontender with nl excursion in the supine position. No bruits or organomegaly, bowel sounds nl  MS:  warm without deformities, calf tenderness, cyanosis or clubbing  SKIN: warm and dry without lesions    NEURO:  alert, approp, no deficits    CXR  08/20/2013 :   No acute process or explanation for chronic shortness of Breath.  Labs from 04/20/13 reviewed with nl tsh, hgb, hc03      Assessment & Plan:

## 2013-08-20 NOTE — Patient Instructions (Signed)
To get the most out of exercise, you need to be continuously aware that you are short of breath, but never out of breath, for 30 minutes daily. As you improve, it will actually be easier for you to do the same amount of exercise  in  30 minutes so always push to the level where you are short of breath.   Protonix 40  Mg Take 30-60 min before first meal of the day automatically   GERD (REFLUX)  is an extremely common cause of respiratory symptoms, many times with no significant heartburn at all.    It can be treated with medication, but also with lifestyle changes including avoidance of late meals, excessive alcohol,  and avoid fatty foods, chocolate, peppermint, colas, red wine, and acidic juices such as orange juice.  NO MINT OR MENTHOL PRODUCTS SO NO COUGH DROPS  USE SUGARLESS CANDY INSTEAD (jolley ranchers or Stover's)  NO OIL BASED VITAMINS - use powdered substitutes.  Please remember to go to the   x-ray department downstairs for your tests - we will call you with the results when they are available.     Please see patient coordinator before you leave today  to schedule cpst first week in December no better

## 2013-08-23 ENCOUNTER — Telehealth: Payer: Self-pay | Admitting: Internal Medicine

## 2013-08-23 NOTE — Telephone Encounter (Signed)
Notes Recorded by Christen Butter, CMA on 08/23/2013 at 10:04 AM lmtcb ------  Notes Recorded by Nyoka Cowden, MD on 08/21/2013 at 6:49 AM Call pt: Reviewed cxr and no acute change so no change in recommendations made at ov Pt aware. Carron Curie, CMA

## 2013-09-20 ENCOUNTER — Ambulatory Visit (HOSPITAL_COMMUNITY): Payer: 59 | Attending: Urology

## 2013-09-20 DIAGNOSIS — R0989 Other specified symptoms and signs involving the circulatory and respiratory systems: Secondary | ICD-10-CM | POA: Insufficient documentation

## 2013-09-20 DIAGNOSIS — R0609 Other forms of dyspnea: Secondary | ICD-10-CM | POA: Insufficient documentation

## 2013-09-20 DIAGNOSIS — R0600 Dyspnea, unspecified: Secondary | ICD-10-CM

## 2013-09-29 ENCOUNTER — Encounter: Payer: Self-pay | Admitting: Internal Medicine

## 2013-09-29 DIAGNOSIS — R0602 Shortness of breath: Secondary | ICD-10-CM

## 2013-09-29 NOTE — Progress Notes (Signed)
Quick Note:  Spoke with pt and notified of results per Dr. Wert. Pt verbalized understanding and denied any questions.  ______ 

## 2013-09-30 ENCOUNTER — Encounter: Payer: Self-pay | Admitting: Internal Medicine

## 2013-09-30 ENCOUNTER — Ambulatory Visit (INDEPENDENT_AMBULATORY_CARE_PROVIDER_SITE_OTHER): Payer: 59 | Admitting: Internal Medicine

## 2013-09-30 ENCOUNTER — Encounter (INDEPENDENT_AMBULATORY_CARE_PROVIDER_SITE_OTHER): Payer: Self-pay

## 2013-09-30 VITALS — BP 104/70 | HR 62 | Temp 98.7°F | Ht 65.0 in | Wt 165.0 lb

## 2013-09-30 DIAGNOSIS — R0600 Dyspnea, unspecified: Secondary | ICD-10-CM

## 2013-09-30 DIAGNOSIS — R0609 Other forms of dyspnea: Secondary | ICD-10-CM

## 2013-09-30 NOTE — Patient Instructions (Addendum)
Dr Beverely Low may consider adding low doses of klonopin during the day but for now probably not needed and should just work on paced exercise 30 min 3x weekly   Ok to stop the acid suppression if you don't think it's helping  Pulmonary follow up is as needed

## 2013-09-30 NOTE — Progress Notes (Signed)
Subjective:    Patient ID: Perry Rodriguez, male    DOB: 1961/05/11   MRN: [id number]    Brief patient profile:  54 yowm never smoker never great at aerobics even through HS eval in mid 90s by pulmonary doctor with nl lung function and referred 08/20/2013 by Dr Beverely Low for re-evaluation of doe and fatigue.   History of Present Illness  08/20/2013 1st [facility] office visit/ Ansleigh Safer cc  Chronic (all his life)  doe x one flight oob at top, can walk 2 miles but only run 50 yards,  Never at rest or sleeping. Also has chronic HB, takes protonix prn.  Does have chronic fatigue and poor sleep hygiene x years with "two neg sleep studies x has restless legs" on clonazepam rec To get the most out of exercise, you need to be continuously aware that you are short of breath, but never out of breath, for 30 minutes daily. As you improve, it will actually be easier for you to do the same amount of exercise  in  30 minutes so always push to the level where you are short of breath.  Protonix 40  Mg Take 30-60 min before first meal of the day automatically  GERD diet   CPST  09/20/13 > nl x for hyperventilation > rec ov to regroup  09/30/2013 f/u ov/Franziska Podgurski re:  Chief Complaint  Patient presents with  . Follow-up    Here to discuss CPST results. Pt states DOE is the same, no better or worse since his last visit. No new co's today.    reproduced symptoms 50% of nl, concerned with tingling and what caused it- classic circum oral pattern Takes clonazepam two pills a day just in the evenings x years   No obvious day to day or daytime variabilty or assoc chronic cough or cp or chest tightness, subjective wheeze overt sinus or hb symptoms. No unusual exp hx or h/o childhood pna/ asthma or knowledge of premature birth.  Sleeping ok without nocturnal  or early am exacerbation  of respiratory  c/o's or need for noct saba. Also denies any obvious fluctuation of symptoms with weather or environmental changes or other  aggravating or alleviating factors except as outlined above   Current Medications, Allergies, Complete Past Medical History, Past Surgical History, Family History, and Social History were reviewed in [employer] record.  ROS  The following are not active complaints unless bolded sore throat, dysphagia, dental problems, itching, sneezing,  nasal congestion or excess/ purulent secretions, ear ache,   fever, chills, sweats, unintended wt loss, pleuritic or exertional cp, hemoptysis,  orthopnea pnd or leg swelling, presyncope, palpitations, heartburn, abdominal pain, anorexia, nausea, vomiting, diarrhea  or change in bowel or urinary habits, change in stools or urine, dysuria,hematuria,  rash, arthralgias, visual complaints, headache, numbness weakness or ataxia or problems with walking or coordination,  change in mood/affect or memory.                       Objective:   Physical Exam  Amb pleasant wm nad  But quite anxious  Wt Readings from Last 3 Encounters:  09/30/13 165 lb (74.844 kg)  08/20/13 160 lb (72.576 kg)  05/19/13 156 lb 6.4 oz (70.943 kg)        HEENT: nl dentition, turbinates, and orophanx. Nl external ear canals without cough reflex   NECK :  without JVD/Nodes/TM/ nl carotid upstrokes bilaterally   LUNGS: no acc muscle use, clear  to A and P bilaterally without cough on insp or exp maneuvers   CV:  RRR  no s3 or murmur or increase in P2, no edema   ABD:  soft and nontender with nl excursion in the supine position. No bruits or organomegaly, bowel sounds nl  MS:  warm without deformities, calf tenderness, cyanosis or clubbing  SKIN: warm and dry without lesions         CXR  08/20/2013 :   No acute process or explanation for chronic shortness of Breath.  Labs from 04/20/13 reviewed with nl tsh, hgb, hc03      Assessment & Plan:

## 2013-10-01 NOTE — Assessment & Plan Note (Signed)
-   cpst 09/20/13 > nl x for hyperventilation  I had an extended discussion with the patient today lasting 15 to 20 minutes of a 25 minute visit on the following issues:  Study reviewed in detail with pt He had classic symptoms during cpst but completely nl study including post ex spirometry that excluded EIA  rec reconditioning/ consider daytime clonazepam as last resort

## 2013-10-13 ENCOUNTER — Other Ambulatory Visit: Payer: Self-pay | Admitting: *Deleted

## 2013-10-13 MED ORDER — CLONAZEPAM 0.5 MG PO TABS
ORAL_TABLET | ORAL | Status: DC
Start: 2013-10-13 — End: 2013-11-09

## 2013-10-13 NOTE — Telephone Encounter (Signed)
Last seen-04/20/2013  Last filled-06/29/2013  UDS-06/10/2013 low risk contract signed   Please advise. SW

## 2013-10-15 ENCOUNTER — Other Ambulatory Visit: Payer: Self-pay | Admitting: Family Medicine

## 2013-10-15 NOTE — Telephone Encounter (Signed)
Med filled #120 with 0 refills to [facility] on 12/31.

## 2013-11-09 ENCOUNTER — Other Ambulatory Visit: Payer: Self-pay | Admitting: *Deleted

## 2013-11-09 NOTE — Telephone Encounter (Signed)
Decrease to #60 as pt is overdue for OV

## 2013-11-09 NOTE — Telephone Encounter (Signed)
Last seen-04/20/2013  Last filled-10/13/2013  UDS-06/10/2013 low risk contract signed   Please advise. SW

## 2013-11-10 MED ORDER — CLONAZEPAM 0.5 MG PO TABS: ORAL_TABLET | ORAL | Status: DC

## 2013-11-10 NOTE — Telephone Encounter (Signed)
Med filled #60.

## 2013-12-10 ENCOUNTER — Ambulatory Visit (INDEPENDENT_AMBULATORY_CARE_PROVIDER_SITE_OTHER): Payer: BC Managed Care – PPO | Admitting: Physician Assistant

## 2013-12-10 ENCOUNTER — Encounter: Payer: Self-pay | Admitting: Physician Assistant

## 2013-12-10 VITALS — BP 126/86 | HR 74 | Temp 99.1°F | Resp 16 | Ht 65.0 in | Wt 161.0 lb

## 2013-12-10 DIAGNOSIS — J329 Chronic sinusitis, unspecified: Secondary | ICD-10-CM

## 2013-12-10 MED ORDER — AZITHROMYCIN 250 MG PO TABS: ORAL_TABLET | ORAL | Status: DC

## 2013-12-10 MED ORDER — HYDROCOD POLST-CHLORPHEN POLST 10-8 MG/5ML PO LQCR: 5.0000 mL | Freq: Two times a day (BID) | ORAL | Status: DC | PRN

## 2013-12-10 NOTE — Patient Instructions (Addendum)
Take antibiotic as prescribed.  Increase fluid intake.  Rest.  Use Saline nasal spray. Take Mucinex. Humidifier in bedroom. Please use Tussionex as directed.  Please call or return to clinic if symptoms are not improving.    Sinusitis Sinusitis is redness, soreness, and swelling (inflammation) of the paranasal sinuses. Paranasal sinuses are air pockets within the bones of your face (beneath the eyes, the middle of the forehead, or above the eyes). In healthy paranasal sinuses, mucus is able to drain out, and air is able to circulate through them by way of your nose. However, when your paranasal sinuses are inflamed, mucus and air can become trapped. This can allow bacteria and other germs to grow and cause infection. Sinusitis can develop quickly and last only a short time (acute) or continue over a long period (chronic). Sinusitis that lasts for more than 12 weeks is considered chronic.  CAUSES  Causes of sinusitis include:  Allergies.  Structural abnormalities, such as displacement of the cartilage that separates your nostrils (deviated septum), which can decrease the air flow through your nose and sinuses and affect sinus drainage.  Functional abnormalities, such as when the small hairs (cilia) that line your sinuses and help remove mucus do not work properly or are not present. SYMPTOMS  Symptoms of acute and chronic sinusitis are the same. The primary symptoms are pain and pressure around the affected sinuses. Other symptoms include:  Upper toothache.  Earache.  Headache.  Bad breath.  Decreased sense of smell and taste.  A cough, which worsens when you are lying flat.  Fatigue.  Fever.  Thick drainage from your nose, which often is green and may contain pus (purulent).  Swelling and warmth over the affected sinuses. DIAGNOSIS  Your caregiver will perform a physical exam. During the exam, your caregiver may:  Look in your nose for signs of abnormal growths in your  nostrils (nasal polyps).  Tap over the affected sinus to check for signs of infection.  View the inside of your sinuses (endoscopy) with a special imaging device with a light attached (endoscope), which is inserted into your sinuses. If your caregiver suspects that you have chronic sinusitis, one or more of the following tests may be recommended:  Allergy tests.  Nasal culture A sample of mucus is taken from your nose and sent to a lab and screened for bacteria.  Nasal cytology A sample of mucus is taken from your nose and examined by your caregiver to determine if your sinusitis is related to an allergy. TREATMENT  Most cases of acute sinusitis are related to a viral infection and will resolve on their own within 10 days. Sometimes medicines are prescribed to help relieve symptoms (pain medicine, decongestants, nasal steroid sprays, or saline sprays).  However, for sinusitis related to a bacterial infection, your caregiver will prescribe antibiotic medicines. These are medicines that will help kill the bacteria causing the infection.  Rarely, sinusitis is caused by a fungal infection. In theses cases, your caregiver will prescribe antifungal medicine. For some cases of chronic sinusitis, surgery is needed. Generally, these are cases in which sinusitis recurs more than 3 times per year, despite other treatments. HOME CARE INSTRUCTIONS   Drink plenty of water. Water helps thin the mucus so your sinuses can drain more easily.  Use a humidifier.  Inhale steam 3 to 4 times a day (for example, sit in the bathroom with the shower running).  Apply a warm, moist washcloth to your face 3 to 4 times  a day, or as directed by your caregiver.  Use saline nasal sprays to help moisten and clean your sinuses.  Take over-the-counter or prescription medicines for pain, discomfort, or fever only as directed by your caregiver. SEEK IMMEDIATE MEDICAL CARE IF:  You have increasing pain or severe  headaches.  You have nausea, vomiting, or drowsiness.  You have swelling around your face.  You have vision problems.  You have a stiff neck.  You have difficulty breathing. MAKE SURE YOU:   Understand these instructions.  Will watch your condition.  Will get help right away if you are not doing well or get worse. Document Released: 09/30/2005 Document Revised: 12/23/2011 Document Reviewed: 10/15/2011 ExitCare Patient Information 2014 [employer].   

## 2013-12-10 NOTE — Assessment & Plan Note (Signed)
Rx Azithromycin.  Rx Tussionex.  Take antibiotic as prescribed.  Increase fluid intake.  Rest.  Saline nasal spray. Mucinex. Humidifier in bedroom.  Please call or return to clinic if symptoms are not improving.

## 2013-12-10 NOTE — Progress Notes (Signed)
Patient presents to clinic today c/o sinus pressure, sinus pain, post-nasal drip, sore throat and bilateral ear fullness for several days.  Symptoms have worsened over the past couple of days. Patient now with fever and productive cough.  Denies shortness of breath, wheezing or pleuritic chest pain.  Past Medical History  Diagnosis Date  . Allergy   . Anemia   . Chronic pain   . GERD (gastroesophageal reflux disease)   . Hyperlipidemia   . Pneumonia 2012    Current Outpatient Prescriptions on File Prior to Visit  Medication Sig Dispense Refill  . clonazePAM (KLONOPIN) 0.5 MG tablet TAKE 1 TABLET BY MOUTH TWICE DAILY AS NEEDED FOR RESTLESS LEGS. PATIENT NEEDS OFFICE VISIT  60 tablet  0  . pantoprazole (PROTONIX) 40 MG tablet Take 40 mg by mouth daily.      . tadalafil (CIALIS) 20 MG tablet Take 1 tablet (20 mg total) by mouth daily as needed for erectile dysfunction.  3 tablet  3  . valACYclovir (VALTREX) 500 MG tablet Take 1 tablet (500 mg total) by mouth 2 (two) times daily as needed.  180 tablet  1   No current facility-administered medications on file prior to visit.    No Known Allergies  Family History  Problem Relation Age of Onset  . Colon cancer Mother   . Heart disease Father   . Hyperlipidemia Father   . Hypertension Father   . Hyperlipidemia Brother   . Alcohol abuse Paternal Uncle   . Alcohol abuse Paternal Grandfather     History   Social History  . Marital Status: Married    Spouse Name: N/A    Number of Children: N/A  . Years of Education: N/A   Occupational History  . Analyst [employer]   Social History Main Topics  . Smoking status: Never Smoker   . Smokeless tobacco: None  . Alcohol Use: Yes     Comment: 2 daily  . Drug Use: No  . Sexual Activity:    Other Topics Concern  . 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 Married 28 yrs. One son. Walks for exercise.   Review of Systems - See HPI.  All  other ROS are negative.  BP 126/86  Pulse 74  Temp(Src) 99.1 F (37.3 C) (Oral)  Resp 16  Ht 5\' 5"  (1.651 m)  Wt 161 lb (73.029 kg)  BMI 26.79 kg/m2  SpO2 98%  Physical Exam  Vitals reviewed. Constitutional: He is oriented to person, place, and time and well-developed, well-nourished, and in no distress.  HENT:  Head: Normocephalic and atraumatic.  Right Ear: External ear normal.  Left Ear: External ear normal.  Nose: Nose normal.  Mouth/Throat: Oropharynx is clear and moist.  Eyes: Conjunctivae are normal. Pupils are equal, round, and reactive to light.  Neck: Neck supple.  Cardiovascular: Normal rate, regular rhythm, normal heart sounds and intact distal pulses.   Pulmonary/Chest: Effort normal and breath sounds normal. No respiratory distress. He has no wheezes. He has no rales. He exhibits no tenderness.  Lymphadenopathy:    He has no cervical adenopathy.  Neurological: He is alert and oriented to person, place, and time.  Skin: Skin is warm and dry. No rash noted.  Psychiatric: Affect normal.   Assessment/Plan: Sinusitis Rx Azithromycin.  Rx Tussionex.  Take antibiotic as prescribed.  Increase fluid intake.  Rest.  Saline nasal spray. Mucinex. Humidifier in bedroom.  Please call  or return to clinic if symptoms are not improving.

## 2013-12-10 NOTE — Progress Notes (Signed)
Pre visit review using our clinic review tool, if applicable. No additional management support is needed unless otherwise documented below in the visit note/SLS  

## 2013-12-22 ENCOUNTER — Telehealth: Payer: Self-pay | Admitting: *Deleted

## 2013-12-22 MED ORDER — CLONAZEPAM 0.5 MG PO TABS: ORAL_TABLET | ORAL | Status: DC

## 2013-12-22 NOTE — Telephone Encounter (Signed)
Ok for #60, 3 refills.  Please convey our apologies for the phone system, they have had repair techs out all week.  Unfortunately I am not able to call him as I am seeing pt's.  Our office manager will address his tech concerns

## 2013-12-22 NOTE — Telephone Encounter (Signed)
Patient was very upset about not being able to get through on the phone lines.  He shared that he is on the verge of changing practices.  He stated that he would like to speak to Dr. Beverely Lowabori and would like for her to call him at [phone].  He called because he needs a refill on his Clonazepam 0.5 mg tablets.  Patient stated the he is about to run out and is having to cut pills in half.  As a result, he is having trouble sleeping at night.  Last OV:  12/10/13 ( with Caryl Neverody Williams, PA-C for Sinusitis) Last filled/amount: 60 tablets and 0 Refills Contract on file; signed on 06/11/13 UDS- 06/10/13- low risk  Please advise.

## 2013-12-22 NOTE — Telephone Encounter (Signed)
Patient called and stated that he is not feeling well, he did not elaborate on voicemail concerning his symptoms. He states that he has tried getting through our phone lines numerous times. Please advise. JG//CMA

## 2013-12-22 NOTE — Telephone Encounter (Signed)
This medication was filled today called into pharmacy and faxed, pt has an upcoming cpe on 01/20/14.    Called pt back and apologized for out phone system. Pt states that he called last week also and never received a call back (was still feeling ill). I notified pt to use mychart if he could not get through on the phones and also gave him my direct extension number.

## 2014-01-19 ENCOUNTER — Telehealth: Payer: Self-pay

## 2014-01-19 NOTE — Telephone Encounter (Signed)
Appointment confirmed.  Did not have time to complete Pre-Visit call.  Stated that he would arrive early for his appointment.

## 2014-01-20 ENCOUNTER — Encounter: Payer: Self-pay | Admitting: Family Medicine

## 2014-01-20 ENCOUNTER — Ambulatory Visit (INDEPENDENT_AMBULATORY_CARE_PROVIDER_SITE_OTHER): Payer: BC Managed Care – PPO | Admitting: Family Medicine

## 2014-01-20 VITALS — BP 112/72 | HR 69 | Temp 98.6°F | Resp 16 | Ht 66.0 in | Wt 154.2 lb

## 2014-01-20 DIAGNOSIS — G2581 Restless legs syndrome: Secondary | ICD-10-CM

## 2014-01-20 DIAGNOSIS — Z0000 Encounter for general adult medical examination without abnormal findings: Secondary | ICD-10-CM | POA: Insufficient documentation

## 2014-01-20 MED ORDER — CLONAZEPAM 0.5 MG PO TABS: ORAL_TABLET | ORAL | Status: DC

## 2014-01-20 NOTE — Progress Notes (Signed)
Pre visit review using our clinic review tool, if applicable. No additional management support is needed unless otherwise documented below in the visit note. 

## 2014-01-20 NOTE — Patient Instructions (Signed)
Follow up in 1 year or as needed Continue the Klonopin as needed for RLS at night Call with any questions or concerns Keep up the good work!  You look great!

## 2014-01-20 NOTE — Assessment & Plan Note (Signed)
Pt's PE WNL.  UTD on colonoscopy.  EKG done to obtain baseline today.  Reviewed recent labs from pt's work- all looked good.  Anticipatory guidance provided.

## 2014-01-20 NOTE — Progress Notes (Signed)
   Subjective:    Patient ID: Perry SimmeringRandy Rodriguez, male    DOB: [birth year], 53 y.o.   MRN: [id number]  HPI CPE- UTD on colonoscopy.  Depression/anxiety- pt decided to see psych instead of self medicating.  Provider told him that klonopin is 'really really bad' and links to Alzheimer's.  Has taken Requip in the past w/o relief of sxs.   Review of Systems Patient reports no vision/hearing changes, anorexia, fever ,adenopathy, persistant/recurrent hoarseness, swallowing issues, chest pain, palpitations, edema, persistant/recurrent cough, hemoptysis, dyspnea (rest,exertional, paroxysmal nocturnal), gastrointestinal  bleeding (melena, rectal bleeding), abdominal pain, excessive heart burn, GU symptoms (dysuria, hematuria, voiding/incontinence issues) syncope, focal weakness, memory loss, numbness & tingling, skin/hair/nail changes, depression, anxiety, abnormal bruising/bleeding, musculoskeletal symptoms/signs.     Objective:   Physical Exam BP 112/72  Pulse 69  Temp(Src) 98.6 F (37 C) (Oral)  Resp 16  Ht 5\' 6"  (1.676 m)  Wt 154 lb 4 oz (69.967 kg)  BMI 24.91 kg/m2  SpO2 98%  General Appearance:    Alert, cooperative, no distress, appears stated age  Head:    Normocephalic, without obvious abnormality, atraumatic  Eyes:    PERRL, conjunctiva/corneas clear, EOM's intact, fundi    benign, both eyes       Ears:    Normal TM's and external ear canals, both ears  Nose:   Nares normal, septum midline, mucosa normal, no drainage   or sinus tenderness  Throat:   Lips, mucosa, and tongue normal; teeth and gums normal  Neck:   Supple, symmetrical, trachea midline, no adenopathy;       thyroid:  No enlargement/tenderness/nodules  Back:     Symmetric, no curvature, ROM normal, no CVA tenderness  Lungs:     Clear to auscultation bilaterally, respirations unlabored  Chest wall:    No tenderness or deformity  Heart:    Regular rate and rhythm, S1 and S2 normal, no murmur, rub   or gallop  Abdomen:      Soft, non-tender, bowel sounds active all four quadrants,    no masses, no organomegaly  Genitalia:    Normal male without lesion, discharge or tenderness  Rectal:    Normal tone, normal prostate, no masses or tenderness  Extremities:   Extremities normal, atraumatic, no cyanosis or edema  Pulses:   2+ and symmetric all extremities  Skin:   Skin color, texture, turgor normal, no rashes or lesions.  Some discoloration at angle of mandible on L  Lymph nodes:   Cervical, supraclavicular, and axillary nodes normal  Neurologic:   CNII-XII intact. Normal strength, sensation and reflexes      throughout          Assessment & Plan:

## 2014-01-20 NOTE — Assessment & Plan Note (Signed)
Chronic problem.  Reviewed article w/ pt that mentioned possible link between Klonopin and Alzheimer's in the elderly.  Discussed that since this treatment is effective in managing his sxs it is reasonable to continue at this time.  Pt in agreement and pleased by discussion.

## 2014-02-25 ENCOUNTER — Telehealth: Payer: Self-pay | Admitting: Family Medicine

## 2014-02-25 DIAGNOSIS — J329 Chronic sinusitis, unspecified: Secondary | ICD-10-CM

## 2014-02-25 NOTE — Telephone Encounter (Signed)
Ok for ENT referral (dx sinusitis)

## 2014-02-25 NOTE — Telephone Encounter (Signed)
Patient has not completed the round of Augmentin.  He still has a day "couple more days" left to complete.  He stated that he's starting to feel better and sputum is still yellow in color.  Patient was advise to complete his round of antibiotics as prescribed and to increase his fluid intake to continue to loosen secretions.  He was also advised to call the office back to schedule an appointment if his symptoms fail to improve or worsens after completing his antibiotics.  He stated understanding and was in agreement.   He also asked would it be possible for Dr. Beverely Lowabori to put in an ENT referral as this is his second sinus infection this year.    Please advise.

## 2014-02-25 NOTE — Telephone Encounter (Signed)
Caller name:Taras Knobloch Relation to UJ:WJXBJYNpt:patient Call back number: [phone] Pharmacy:  Reason for call: patient called stating that he went to an urgent care on Wednesday (02/16/2014) for a sinus infection and they prescribed Augmentin for 10 days. Patient states that he is feeling better but still is coughing up yellow mucus. Patient is not sure if he needs to give it more time or need to be seen again. Please advise.

## 2014-02-25 NOTE — Telephone Encounter (Signed)
Referral ordered. Patient made aware and knows to expect a call from our office regarding referral appointment soon.

## 2014-02-27 IMAGING — CR DG CHEST 2V
2 series · 2 of 2 positions shown · non-contrast
Comparison: None.

CLINICAL DATA: Chronic shortness of Breath.

EXAM:
CHEST  2 VIEW

[view not recorded (1 of 2)]
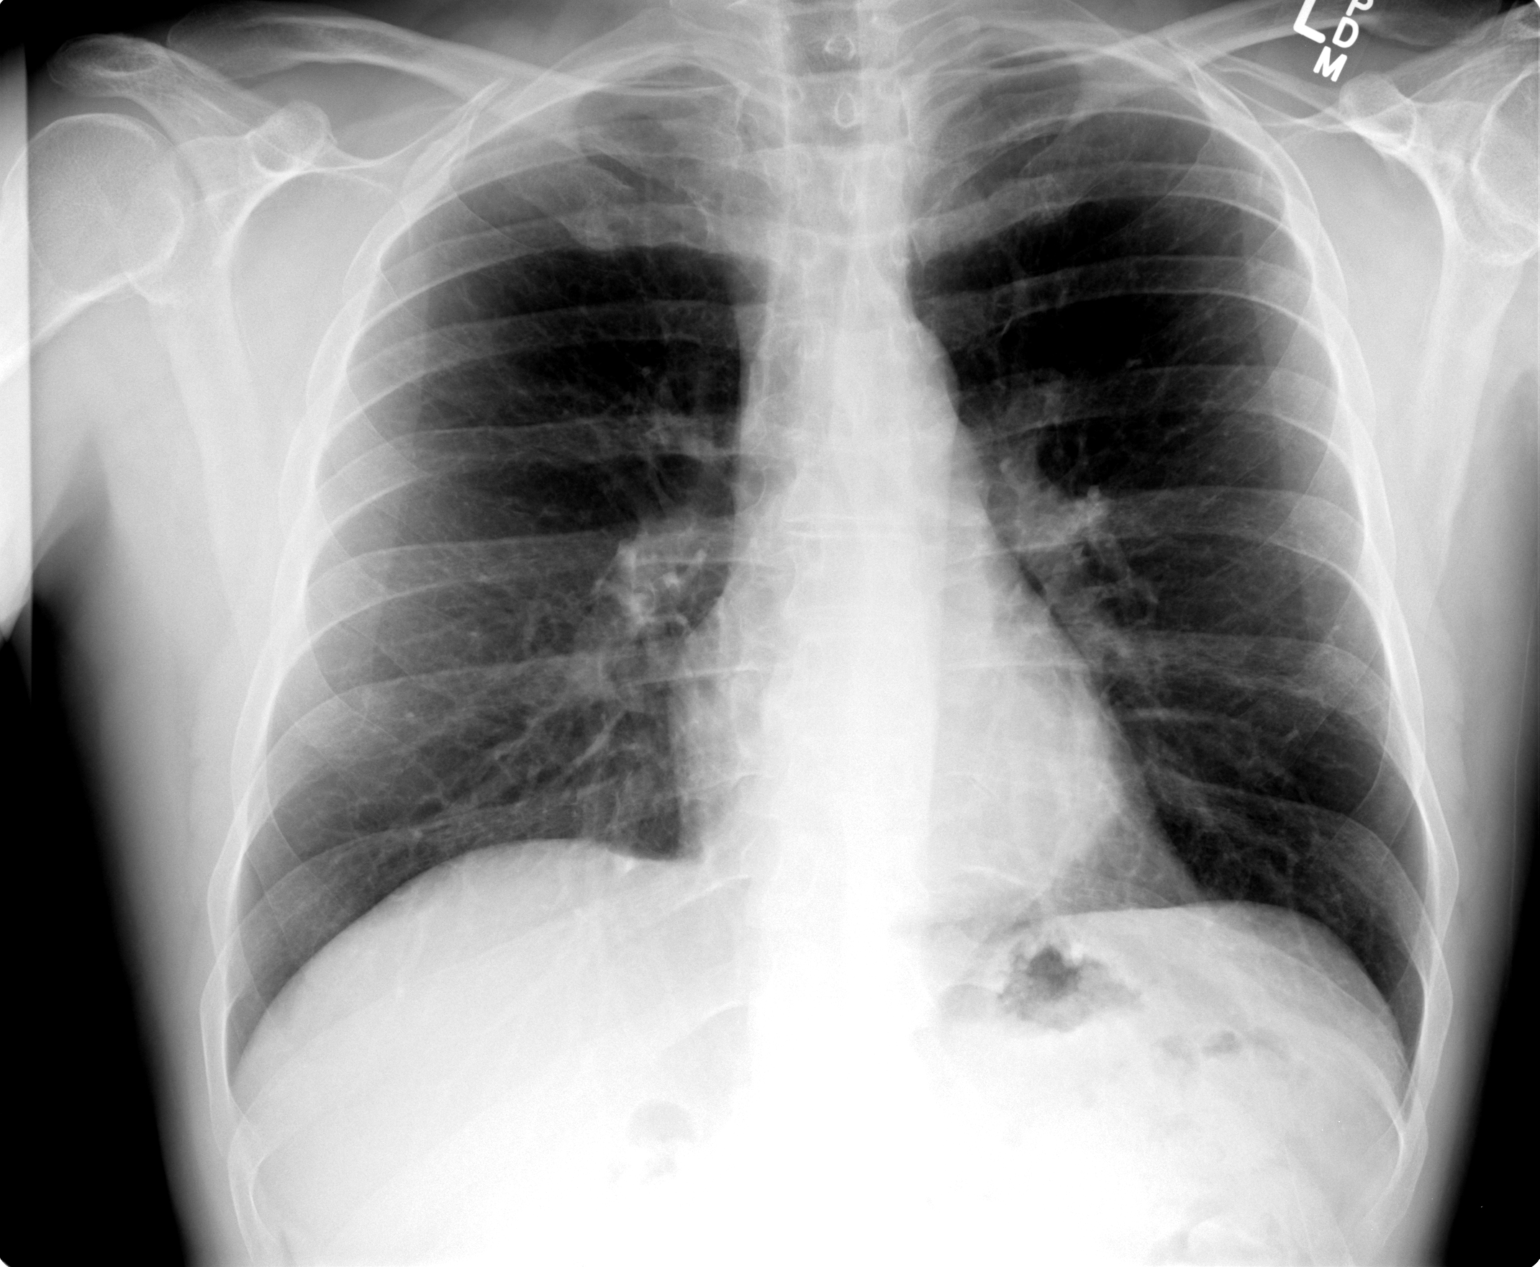

[view not recorded (2 of 2)]
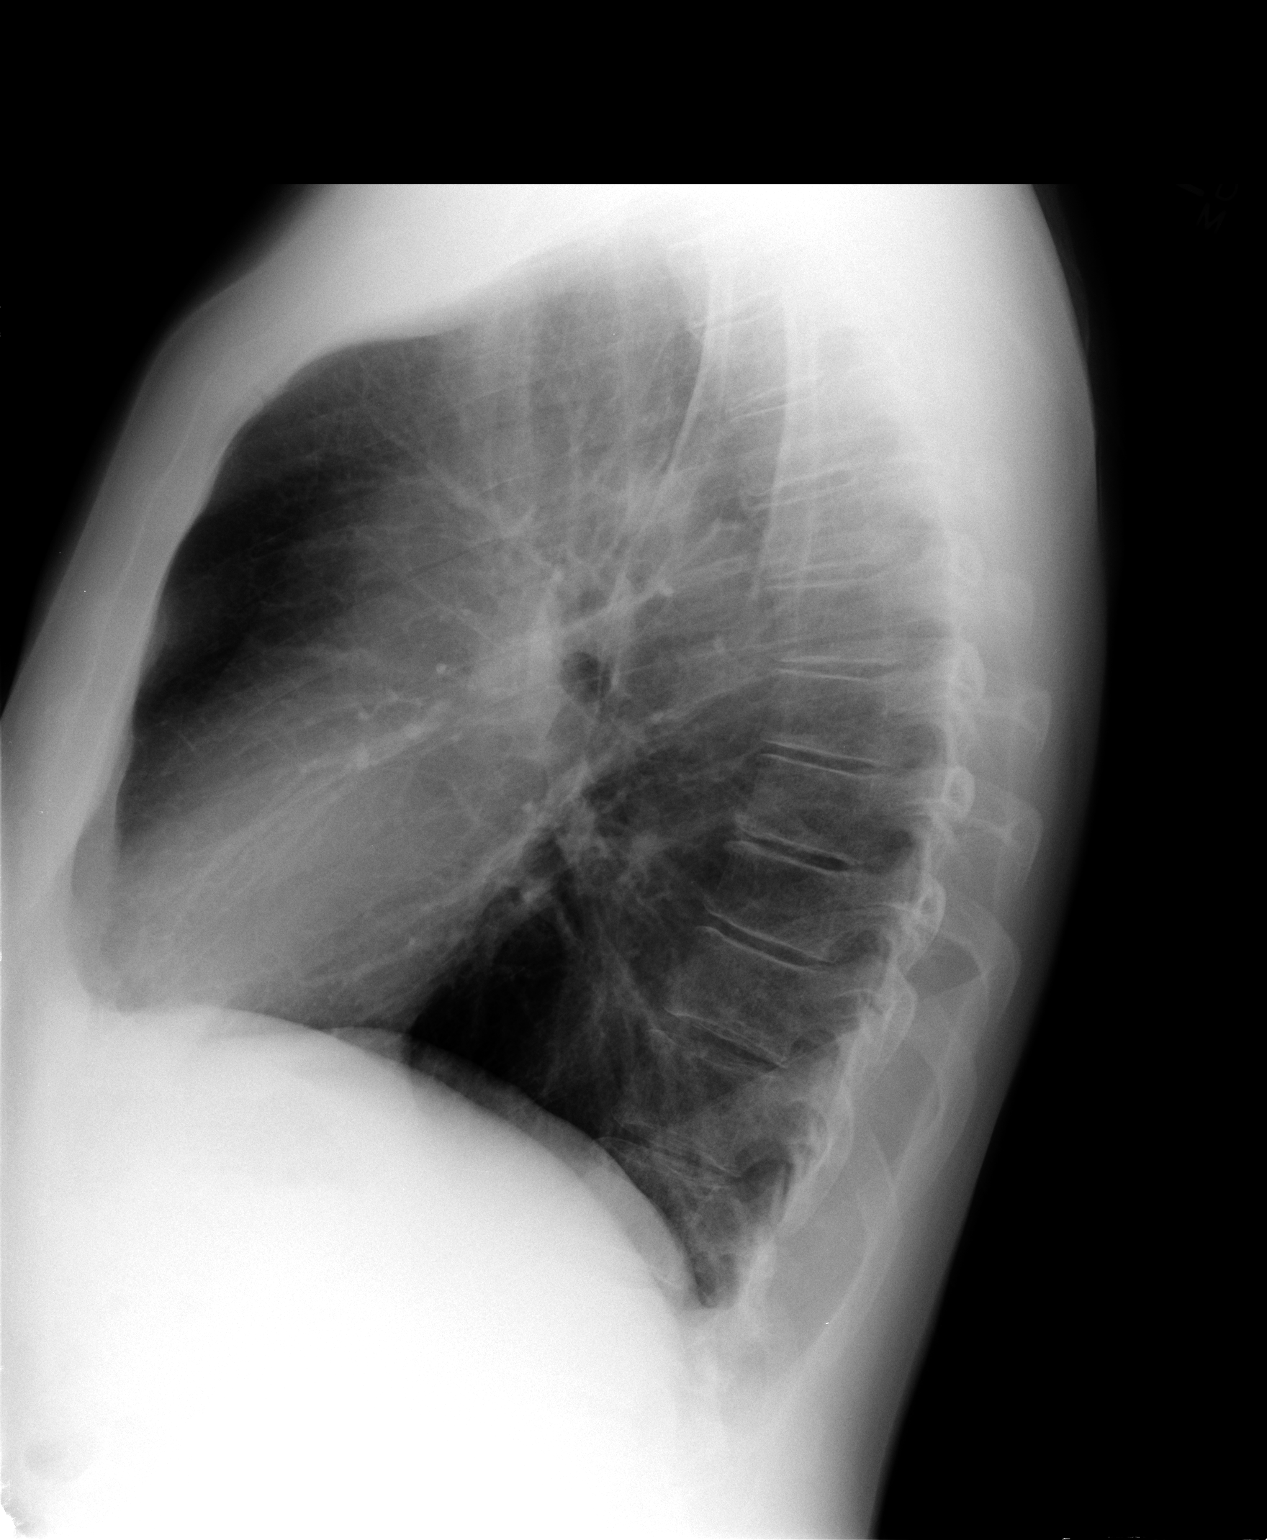

[2 of 2 positions shown; findings below may reference images not displayed]

FINDINGS: Midline trachea. Normal heart size and mediastinal contours. No
pleural effusion or pneumothorax. Suspect mild volume loss and
interstitial thickening at the left lung base on the frontal
radiograph. Right lung clear.
IMPRESSION: No acute process or explanation for chronic shortness of Breath.

## 2014-07-13 ENCOUNTER — Other Ambulatory Visit: Payer: Self-pay | Admitting: Family Medicine

## 2014-07-13 NOTE — Telephone Encounter (Signed)
Pt is needing new rx for clonazePAM (KLONOPIN) 0.5 MG tablet Sent [facility] [address].

## 2014-07-13 NOTE — Telephone Encounter (Signed)
Med filled and faxed.  

## 2014-08-08 ENCOUNTER — Encounter: Payer: BC Managed Care – PPO | Admitting: Medical

## 2014-08-08 ENCOUNTER — Encounter: Payer: Self-pay | Admitting: Medical

## 2014-08-08 DIAGNOSIS — Z0289 Encounter for other administrative examinations: Secondary | ICD-10-CM

## 2014-08-08 NOTE — Progress Notes (Signed)
Pre visit review using our clinic review tool, if applicable. No additional management support is needed unless otherwise documented below in the visit note. 

## 2014-09-13 ENCOUNTER — Other Ambulatory Visit: Payer: Self-pay | Admitting: Family Medicine

## 2014-09-13 NOTE — Telephone Encounter (Signed)
clonazePAM (KLONOPIN) 0.5 MG tablet TAKE 1 TABLET BY MOUTH TWICE DAILY AS NEEDED FOR RESTLESS LEGS Last filled on 07/13/2014 #60, 0 refills Last OV: 01/20/2014 UDS Due

## 2014-09-16 ENCOUNTER — Telehealth: Payer: Self-pay | Admitting: Family Medicine

## 2014-09-16 ENCOUNTER — Other Ambulatory Visit: Payer: Self-pay | Admitting: Family Medicine

## 2014-09-16 MED ORDER — VALACYCLOVIR HCL 500 MG PO TABS
500.0000 mg | ORAL_TABLET | Freq: Two times a day (BID) | ORAL | Status: AC | PRN
Start: 2014-09-16 — End: ?

## 2014-09-16 NOTE — Telephone Encounter (Signed)
Caller name: Maurine Simmeringldieri, Chipper Relation to pt: self  Call back number:[phone] [phone]  Reason for call:   Pt requesting a refill valACYclovir (VALTREX) 500 MG tablet

## 2014-09-16 NOTE — Telephone Encounter (Signed)
Med filled.  

## 2014-10-21 ENCOUNTER — Other Ambulatory Visit: Payer: Self-pay | Admitting: Family Medicine

## 2014-10-21 NOTE — Telephone Encounter (Signed)
Last OV 01-20-14 Clonazepam last filled 09-14-14 #60 with 0  No upcoming appts.

## 2014-10-21 NOTE — Telephone Encounter (Signed)
Med filled and faxed.  

## 2014-11-16 NOTE — Progress Notes (Signed)
This encounter was created in error - please disregard.

## 2015-01-26 ENCOUNTER — Telehealth: Payer: Self-pay | Admitting: Family Medicine

## 2015-01-26 MED ORDER — TRAZODONE HCL 50 MG PO TABS: 25.0000 mg | ORAL_TABLET | Freq: Every evening | ORAL | Status: DC | PRN

## 2015-01-26 NOTE — Telephone Encounter (Signed)
Let's try trazodone nightly, ok for #30, make sure to take a few times before travel to know how it works.

## 2015-01-26 NOTE — Telephone Encounter (Signed)
Does pt have an idea of which medication he wants?  He was previously on Klonopin for RLS- this would help w/ sleep but not sure this is what he has in mind.  Ambien may cause him to sleep too deeply or if it triggers odd behaviors- walking, talking, eating, hostility- this may pose a problem on a plane

## 2015-01-26 NOTE — Telephone Encounter (Signed)
Caller name: Patrick Relation to pt: self Call back number: [phone] Pharmacy: [facility] [address]  Reason for call:   Patient states that he will be taking a red eye flight and is requesting two sleeping pills for this. The first one to take prior to see if it works and the second pill to take night of the flight.

## 2015-01-26 NOTE — Telephone Encounter (Signed)
Spoke with pt. He agrees that Ambien may not be the best. However he is really looking for something that will make sure he sleeps. States that the Clonazepam helps him rest but usually does not make him sleepy.

## 2015-01-26 NOTE — Telephone Encounter (Signed)
Please advise what medication would be ok to take, and if ok to send in. Pt was last seen 01/2014 for CPE. CPE is scheduled for July.

## 2015-01-26 NOTE — Telephone Encounter (Signed)
Medication filled.  

## 2015-01-30 ENCOUNTER — Telehealth: Payer: Self-pay | Admitting: Family Medicine

## 2015-01-30 ENCOUNTER — Encounter: Payer: Self-pay | Admitting: Medical

## 2015-01-30 ENCOUNTER — Ambulatory Visit (INDEPENDENT_AMBULATORY_CARE_PROVIDER_SITE_OTHER): Payer: BLUE CROSS/BLUE SHIELD | Admitting: Medical

## 2015-01-30 VITALS — BP 133/79 | HR 75 | Temp 98.4°F | Ht 66.0 in | Wt 156.0 lb

## 2015-01-30 DIAGNOSIS — R0789 Other chest pain: Secondary | ICD-10-CM | POA: Diagnosis not present

## 2015-01-30 DIAGNOSIS — M940 Chondrocostal junction syndrome [Tietze]: Secondary | ICD-10-CM

## 2015-01-30 DIAGNOSIS — R0602 Shortness of breath: Secondary | ICD-10-CM | POA: Diagnosis not present

## 2015-01-30 DIAGNOSIS — R0600 Dyspnea, unspecified: Secondary | ICD-10-CM | POA: Diagnosis not present

## 2015-01-30 LAB — D-DIMER, QUANTITATIVE (NOT AT ARMC): D DIMER QUANT: 0.27 ug{FEU}/mL (ref 0.00–0.48)

## 2015-01-30 LAB — TROPONIN I: Troponin I: <0.01 ng/mL (ref <0.06)

## 2015-01-30 NOTE — Telephone Encounter (Signed)
Patient advised to go to ED, spoke with office and and declined this advice.  Patient scheduled with PA today.

## 2015-01-30 NOTE — Assessment & Plan Note (Signed)
Much better now and pt refused to go to ED earlier. So I am doing troponin out pt. Ekg looks normal excetp v3 lead appears artifact. If pressure worsens again or troponin elevated then ED eval.

## 2015-01-30 NOTE — Assessment & Plan Note (Signed)
alleve otc 2 tab twice daily.

## 2015-01-30 NOTE — Assessment & Plan Note (Signed)
Some dyspnea in past but with mild left popliteal pain if d-dimer elevated then will need to go to ED.

## 2015-01-30 NOTE — Progress Notes (Signed)
Subjective:    Patient ID: Perry SimmeringRandy Genco, male    DOB: 1961-09-08, 54 y.o.   MRN: [id number]  HPI  Pt in stating he feeling some chest pressure this am. Started some time between 8-9. This was very faint mild pressure. Faint 1-2/10 pressure now when breaths.. Friday he did a lot of work lifting exhibit at [employer]. He was digging and lifting a lot rock and mud. So he was very active and moving with wheel barrow felt pain. Points  deep inspiration feels little pain. No history of smoking. Pt works out 5 days a week. He lifts weight and does elliptical every other day. Pt has hx of feeling some shortness of breath and saw pulmonolgist. PFT testing were normal.   I don't see lipid panel on review of his chart. Pt at work labs  numbers cholesterol was 207. Pt can't remember exact ldl level. Hdl may have been 58.  Pt Dad had some CAD. He had stents.  This morning felt mild low level chest pain felt sob. Pt does have some tenderness of left leg. He states it always there for years since working out.   Review of Systems  Constitutional: Negative for fever, chills and fatigue.  Respiratory: Positive for shortness of breath. Negative for cough, chest tightness and wheezing.        This am.  Cardiovascular: Positive for chest pain. Negative for palpitations.       Very faint now.  Neurological: Negative for dizziness, seizures, facial asymmetry, light-headedness, numbness and headaches.  Hematological: Negative for adenopathy. Does not bruise/bleed easily.  Psychiatric/Behavioral: Negative for suicidal ideas, confusion, sleep disturbance, dysphoric mood and agitation. The patient is nervous/anxious.    Past Medical History  Diagnosis Date  . Allergy   . Anemia   . Chronic pain   . GERD (gastroesophageal reflux disease)   . Hyperlipidemia   . Pneumonia 2012    History   Social History  . Marital Status: Married    Spouse Name: N/A  . Number of Children: N/A  . Years of  Education: N/A   Occupational History  . Analyst [employer]   Social History Main Topics  . Smoking status: Never Smoker   . Smokeless tobacco: Not on file  . Alcohol Use: Yes     Comment: 2 daily  . Drug Use: No  . Sexual Activity: Not on file   Other Topics Concern  . Not on file   Social History Narrative   Works in office at [employer] Has advanced degree. Married 28 yrs. One son. Walks for exercise.    Past Surgical History  Procedure Laterality Date  . Appendectomy  2010  . Shoulder surgery  1995    rotator cuff  . Eye surgery      cataract    Family History  Problem Relation Age of Onset  . Colon cancer Mother   . Heart disease Father   . Hyperlipidemia Father   . Hypertension Father   . Hyperlipidemia Brother   . Alcohol abuse Paternal Uncle   . Alcohol abuse Paternal Grandfather     No Known Allergies  Current Outpatient Prescriptions on File Prior to Visit  Medication Sig Dispense Refill  . clonazePAM (KLONOPIN) 0.5 MG tablet TAKE 1 TABLET BY MOUTH TWICE DAILY AS NEEDED FOR RESTLESS LEGS 60 tablet 3  . pantoprazole (PROTONIX) 40 MG tablet Take 40 mg by mouth daily.    Marland Kitchen. FLUoxetine (PROZAC) 20 MG capsule Take  2 capsules by mouth daily.    . tadalafil (CIALIS) 20 MG tablet Take 1 tablet (20 mg total) by mouth daily as needed for erectile dysfunction. 3 tablet 3  . traZODone (DESYREL) 50 MG tablet Take 0.5-1 tablets (25-50 mg total) by mouth at bedtime as needed for sleep. (Patient not taking: Reported on 01/30/2015) 30 tablet 0  . valACYclovir (VALTREX) 500 MG tablet Take 1 tablet (500 mg total) by mouth 2 (two) times daily as needed. 180 tablet 1   No current facility-administered medications on file prior to visit.    BP 133/79 mmHg  Pulse 75  Temp(Src) 98.4 F (36.9 C) (Oral)  Ht  (1.676 m)  Wt 156 lb (70.761 kg)  BMI 25.19 kg/m2  SpO2 100%      Objective:   Physical Exam   General Mental Status- Alert. General Appearance- Not in  acute distress.   Skin General: Color- Normal Color. Moisture- Normal Moisture.  Neck Carotid Arteries- Normal color. Moisture- Normal Moisture. No carotid bruits. No JVD.  Chest and Lung Exam Auscultation: Breath Sounds:-Normal. Even and unlabored.  Cardiovascular Auscultation:Rythm- Regular, rate and rhythm. Murmurs & Other Heart Sounds:Auscultation of the heart reveals- No Murmurs.  Abdomen Inspection:-Inspeection Normal. Palpation/Percussion:Note:No mass. Palpation and Percussion of the abdomen reveal- Non Tender, Non Distended + BS, no rebound or guarding.  Anterior thorax- mild chest wall pain on deep inspriation. No pain on palpation.  Lt lower ext- faint + homans sign.  No swelling of his legs/calf.   Neurologic Cranial Nerve exam:- CN III-XII intact(No nystagmus), symmetric smile. Drift Test:- No drift. Romberg Exam:- Negative.  Heal to Toe Gait exam:-Normal. Finger to Nose:- Normal/Intact Strength:- 5/5 equal and symmetric strength both upper and lower extremities.     Assessment & Plan:

## 2015-01-30 NOTE — Telephone Encounter (Signed)
Patient called in complaining of chest pain and SOB. Call [facility] to [facility]

## 2015-01-30 NOTE — Telephone Encounter (Signed)
Spoke with pt. He denies any chest pain, dizziness, or Headaches. Feels like it is a chest pressure (states it is only mild) and some SOB. Pt was advised to go to ED. Denied this advise states that the pressure is only a 1-2 and feels as though it is related to his strenuous yard work that was completed on Friday. Pt was scheduled at office with PA Esperanza RichtersEdward Saguier at 2:15pm. Pt again advised to seek ED if symptoms worsen.

## 2015-01-30 NOTE — Patient Instructions (Addendum)
Chest pressure Much better now and pt refused to go to ED earlier. So I am doing troponin out pt. Ekg looks normal excetp v3 lead appears artifact. If pressure worsens again or troponin elevated then ED eval.   Dyspnea Some dyspnea in past but with mild left popliteal pain if d-dimer elevated then will need to go to ED.   Costochondritis alleve otc 2 tab twice daily.     Pt sent to lab. I walked over with him. He was only one in lab and they could have drawn quickly. Later lpn tells me he left without getting labs drawn.  Follow up in 7 days or as needed

## 2015-01-30 NOTE — Progress Notes (Signed)
Pre visit review using our clinic review tool, if applicable. No additional management support is needed unless otherwise documented below in the visit note. 

## 2015-01-30 NOTE — Telephone Encounter (Signed)
[facility] Day - Client TELEPHONE ADVICE RECORD [facility] Patient Name: Perry SimmeringRANDY Tripodi DOB: [birth year] Initial Comment caller states having chest pain and SOB Nurse Assessment Nurse: Roderic OvensNorth, RN, Amy Date/Time [name](Eastern Time): 01/30/2015 11:53:35 AM Confirm and document reason for call. If symptomatic, describe symptoms. ---Caller states that he started having some chest pain and sob. He states that he did some strenuous work on Friday. He states he does not feel the pain under the rib cage. He feels the pain behind the breast bone mid-sternal. The pain is constant. It is a pressure that is there. No changes in taking a deep breathe and blows it out. Sob is at rest. Constant pressure 2/10. He has never had anything like this before. Has the patient traveled out of the country within the last 30 days? ---Not Applicable Does the patient require triage? ---Yes Related visit to physician within the last 2 weeks? ---Yes Does the PT have any chronic conditions? (i.e. diabetes, asthma, etc.) ---Yes List chronic conditions. ---reflux Guidelines Guideline Title Affirmed Question Affirmed Notes Chest Pain SEVERE chest pain Final Disposition User Go to ED Now [place], Charity fundraiserN, Amy Comments PROTOCOL IF PATIENT REFUSES ED OUTCOME MUST CALL THE OFFICE AND SPEAK WITH THE NURSE. PATIENT REFUSES TO GO INTO THE ED. HE STATES THAT HE HAS AN EYE APPT AT [facility] THIS AFTERNOON AND HE WANTED AN APPT AT THE SAME TIME. INFORMED HIM THAT IS NOT THE APPROPRIATE OUTCOME AND IT IS THE ED OUTCOME. INFORMED HIM THAT WOULD CALL THE OFFICE AND GIVE THEM THE INFORMATION AND THAT SOMEONE WOULD BE IN CONTACT WITH HIM. HE STATES THAT IF HE CAN NOT GET AN APPT THAT HE WILL NOT BE DOING ANYTHING. INFORMED HIM THAT WAS UP TO HIM. CALLED THE OFFICE AND SPOKE WITH DR. TABORIS NURSE AND SHE AGREED WITH THE OUTCOME. SHE WILL CALL THE PATIENT AS WELL.

## 2015-01-31 ENCOUNTER — Telehealth: Payer: Self-pay | Admitting: Family Medicine

## 2015-01-31 NOTE — Telephone Encounter (Signed)
Called patient with results of Troponin and D-Dimer tests.

## 2015-01-31 NOTE — Telephone Encounter (Signed)
Caller name: Lawrance Relation to pt: self Call back number: [phone] Pharmacy:  Reason for call:   Requesting lab results

## 2015-01-31 NOTE — Telephone Encounter (Signed)
Ok for #30, 6 refills 

## 2015-01-31 NOTE — Telephone Encounter (Signed)
Caller name: [place] Relation to pt: self Call back number:[phone] [facility] [address]  Reason for call:   Requesting pantoprazole refill

## 2015-02-01 ENCOUNTER — Telehealth: Payer: Self-pay | Admitting: Family Medicine

## 2015-02-01 MED ORDER — PANTOPRAZOLE SODIUM 40 MG PO TBEC: 40.0000 mg | DELAYED_RELEASE_TABLET | Freq: Every day | ORAL | Status: AC

## 2015-02-01 NOTE — Telephone Encounter (Signed)
Patient returned call.  Relayed the below information to patient.  Patient questioning what the notes from his last office visit stated regarding his chest pain.  Patient states his pain is resolving, only hurts when he pushes on the area.  Notified patient that per Esperanza RichtersEdward Saguier, PA, he suspected costochondritis and educated patient on this condition.  Patient stated understanding.

## 2015-02-01 NOTE — Telephone Encounter (Signed)
Med filled.  

## 2015-02-01 NOTE — Telephone Encounter (Signed)
Called patient at [phone] [facility] *Preferred* and left message to return call.

## 2015-02-01 NOTE — Telephone Encounter (Signed)
Caller name:Rashun Shewell Relationship to patient: self Can be reached: [phone]   Reason for call: Pt calling in reference to new rx traZODone (DESYREL) 50 MG tablet - updated med list in system now- concerned that may react with his other meds that he takes for restless leg syndrome. Wanting to speak with [place] regarding upcoming trip to [place] - leaving Monday. Was seen for chest pain by Ramon DredgeEdward for chest pain- muscle related per PT.

## 2015-02-02 NOTE — Telephone Encounter (Signed)
Please advise 

## 2015-02-02 NOTE — Telephone Encounter (Signed)
Called patient and left message on voicemail.  Notified patient of recommendations and gave call back number for any questions.

## 2015-02-02 NOTE — Telephone Encounter (Signed)
Pt's Aleve 2 tabs twice daily (w/ food) should take care of the pain he is having.  I agree w/ his current treatment and would hold off on steroid injxn at this time due to possible side effects

## 2015-02-02 NOTE — Telephone Encounter (Signed)
Patient called back wanting to know if a steroid injection would help with the pain that he is having??? Best # [phone]

## 2015-03-02 ENCOUNTER — Other Ambulatory Visit: Payer: Self-pay | Admitting: Family Medicine

## 2015-03-02 NOTE — Telephone Encounter (Signed)
Med filled and faxed.  

## 2015-03-02 NOTE — Telephone Encounter (Signed)
Last OV 01/20/14 (CPE appt 04/20/15) Clonazepam last filled 10-21-14 # 60 with 3

## 2015-03-19 ENCOUNTER — Other Ambulatory Visit: Payer: Self-pay | Admitting: Pain Medicine

## 2015-03-27 ENCOUNTER — Other Ambulatory Visit: Payer: Self-pay | Admitting: Family Medicine

## 2015-03-28 NOTE — Telephone Encounter (Signed)
Med filled and faxed.  

## 2015-03-28 NOTE — Telephone Encounter (Signed)
Last CPE 01-20-14, upcoming in July 2016 Clonazepam last filled 03-02-15 # 60 with 0

## 2015-03-29 ENCOUNTER — Telehealth: Payer: Self-pay | Admitting: Family Medicine

## 2015-03-29 NOTE — Telephone Encounter (Signed)
Pre Visit letter sent  °

## 2015-04-07 ENCOUNTER — Telehealth: Payer: Self-pay | Admitting: General Practice

## 2015-04-07 NOTE — Telephone Encounter (Signed)
From: Burnett Harry   Sent: 04/07/2015 10:39 AM    To: Sheliah Hatch, MD   Patient received a no-show fee for 10.26.15 with Ramon Dredge, he called earlier this week to complain. I have emailed billing to have the fee waived since it was his first no-show, and called yesterday to inform the patient. He called back today upset stating I never called him I spoke with him and apologized but now he wants a call from you. He is complaining about the phone system (from [facility]) and states he can not continue to be a patient here if we can not provider him, in writing, a plan so that he can better communicate with you.   He has no medical concerns, but was adamant about hearing from you because he doesn't think my apologizes mean anything:)    Let me know how you want to proceed!    I called and spoke with patient in regards to the note written above. Patient was sincere in stating that yes, he is frustrated and that he would like to discuss his concerns about the phone system and the way that phone calls are handled/documented.  He is asking that his concerns be conveyed to both office and [facility] as he feels this is a recurrent problem w/ his attempts at communication with our offices.  At this time, he is not asking for Dr Beverely Low to provide a written communication plan but would like to discuss his concerns and the next steps (he is asking for change).

## 2015-04-14 ENCOUNTER — Telehealth: Payer: Self-pay | Admitting: *Deleted

## 2015-04-14 NOTE — Telephone Encounter (Signed)
Unable to reach patient at time of Pre-Visit Call.  Left message for patient to return call when available.    

## 2015-04-18 ENCOUNTER — Ambulatory Visit (INDEPENDENT_AMBULATORY_CARE_PROVIDER_SITE_OTHER): Payer: BLUE CROSS/BLUE SHIELD | Admitting: Family Medicine

## 2015-04-18 ENCOUNTER — Encounter: Payer: Self-pay | Admitting: Family Medicine

## 2015-04-18 VITALS — BP 122/84 | HR 74 | Temp 98.4°F | Resp 16 | Ht 65.25 in | Wt 157.2 lb

## 2015-04-18 DIAGNOSIS — Z0000 Encounter for general adult medical examination without abnormal findings: Secondary | ICD-10-CM | POA: Diagnosis not present

## 2015-04-18 DIAGNOSIS — Z202 Contact with and (suspected) exposure to infections with a predominantly sexual mode of transmission: Secondary | ICD-10-CM

## 2015-04-18 NOTE — Patient Instructions (Signed)
Follow up in 1 year or as needed We'll notify you of your lab results and make any changes if needed Try and make healthy food choices and get regular exercise Call with any questions or concerns Have a great summer!!!

## 2015-04-18 NOTE — Progress Notes (Signed)
   Subjective:    Patient ID: Perry Rodriguez, male    DOB: 1961-07-07, 54 y.o.   MRN: [id number]  HPI CPE- [facility] [place] [facility] [place] [facility]  Pt reports being UTD on tetanus- date unknown  No concerns today   Review of Systems Patient reports no vision/ hearing changes, adenopathy,fever, weight change,  persistant/recurrent hoarseness , swallowing issues, chest pain, palpitations, edema, persistant/recurrent cough, hemoptysis, dyspnea (rest/exertional/paroxysmal nocturnal), gastrointestinal bleeding (melena, rectal bleeding), abdominal pain, significant heartburn, bowel changes, GU symptoms (dysuria, hematuria, incontinence), Gyn symptoms (abnormal  bleeding, pain),  syncope, focal weakness, memory loss, numbness & tingling, skin/hair/nail changes, abnormal bruising or bleeding, anxiety, or depression.     Objective:   Physical Exam BP 122/84 mmHg  Pulse 74  Temp(Src) 98.4 F (36.9 C) (Oral)  Resp 16  Ht 5' 5.25" (1.657 m)  Wt 157 lb 3.2 oz (71.305 kg)  BMI 25.97 kg/m2  SpO2 98%  General Appearance:    Alert, cooperative, no distress, appears stated age  Head:    Normocephalic, without obvious abnormality, atraumatic  Eyes:    PERRL, conjunctiva/corneas clear, EOM's intact, fundi    benign, both eyes       Ears:    Normal TM's and external ear canals, both ears  Nose:   Nares normal, septum midline, mucosa normal, no drainage   or sinus tenderness  Throat:   Lips, mucosa, and tongue normal; teeth and gums normal  Neck:   Supple, symmetrical, trachea midline, no adenopathy;       thyroid:  No enlargement/tenderness/nodules  Back:     Symmetric, no curvature, ROM normal, no CVA tenderness  Lungs:     Clear to auscultation bilaterally, respirations unlabored  Chest wall:    No tenderness or deformity  Heart:    Regular rate and rhythm, S1 and S2 normal, no murmur, rub   or gallop  Abdomen:     Soft, non-tender, bowel sounds active all four quadrants,   no masses, no organomegaly  Genitalia:    Normal male without lesion, discharge or tenderness  Rectal:    Normal tone, normal prostate, no masses or tenderness  Extremities:   Extremities normal, atraumatic, no cyanosis or edema  Pulses:   2+ and symmetric all extremities  Skin:   Skin color, texture, turgor normal, no rashes or lesions  Lymph nodes:   Cervical, supraclavicular, and axillary nodes normal  Neurologic:   CNII-XII intact. Normal strength, sensation and reflexes      throughout          Assessment & Plan:

## 2015-04-18 NOTE — Progress Notes (Signed)
Pre visit review using our clinic review tool, if applicable. No additional management support is needed unless otherwise documented below in the visit note. 

## 2015-04-18 NOTE — Assessment & Plan Note (Signed)
Pt's PE WNL.  UTD on colonoscopy- due in 2018.  Check labs.  Anticipatory guidance provided.

## 2015-04-19 LAB — CBC WITH DIFFERENTIAL/PLATELET
BASOS ABS: 0 10*3/uL (ref 0.0–0.1)
BASOS PCT: 0.5 % (ref 0.0–3.0)
Eosinophils Absolute: 0.1 10*3/uL (ref 0.0–0.7)
Eosinophils Relative: 1.7 % (ref 0.0–5.0)
HCT: 43.6 % (ref 39.0–52.0)
HEMOGLOBIN: 14.6 g/dL (ref 13.0–17.0)
Lymphocytes Relative: 32.5 % (ref 12.0–46.0)
Lymphs Abs: 1.6 10*3/uL (ref 0.7–4.0)
MCHC: 33.6 g/dL (ref 30.0–36.0)
MCV: 90 fl (ref 78.0–100.0)
MONOS PCT: 9.3 % (ref 3.0–12.0)
Monocytes Absolute: 0.5 10*3/uL (ref 0.1–1.0)
NEUTROS ABS: 2.8 10*3/uL (ref 1.4–7.7)
Neutrophils Relative %: 56 % (ref 43.0–77.0)
Platelets: 232 10*3/uL (ref 150.0–400.0)
RBC: 4.84 Mil/uL (ref 4.22–5.81)
RDW: 12.9 % (ref 11.5–15.5)
WBC: 5 10*3/uL (ref 4.0–10.5)

## 2015-04-19 LAB — BASIC METABOLIC PANEL
BUN: 11 mg/dL (ref 6–23)
CO2: 26 meq/L (ref 19–32)
Calcium: 9 mg/dL (ref 8.4–10.5)
Chloride: 104 mEq/L (ref 96–112)
Creatinine, Ser: 1.07 mg/dL (ref 0.40–1.50)
GFR: 76.29 mL/min (ref >60.00)
Glucose, Bld: 85 mg/dL (ref 70–99)
Potassium: 4 mEq/L (ref 3.5–5.1)
SODIUM: 139 meq/L (ref 135–145)

## 2015-04-19 LAB — GC/CHLAMYDIA PROBE AMP, URINE
Chlamydia, Swab/Urine, PCR: NEGATIVE
GC PROBE AMP, URINE: NEGATIVE

## 2015-04-19 LAB — PSA: PSA: 1.22 ng/mL (ref 0.10–4.00)

## 2015-04-19 LAB — LIPID PANEL
CHOLESTEROL: 206 mg/dL — AB (ref 0–200)
HDL: 50.3 mg/dL (ref >39.00)
LDL CALC: 130 mg/dL — AB (ref 0–99)
NonHDL: 155.7
Total CHOL/HDL Ratio: 4
Triglycerides: 129 mg/dL (ref 0.0–149.0)
VLDL: 25.8 mg/dL (ref 0.0–40.0)

## 2015-04-19 LAB — HEPATIC FUNCTION PANEL
ALBUMIN: 4 g/dL (ref 3.5–5.2)
ALK PHOS: 54 U/L (ref 39–117)
ALT: 14 U/L (ref 0–53)
AST: 14 U/L (ref 0–37)
Bilirubin, Direct: 0.1 mg/dL (ref 0.0–0.3)
Total Bilirubin: 0.8 mg/dL (ref 0.2–1.2)
Total Protein: 6.6 g/dL (ref 6.0–8.3)

## 2015-04-19 LAB — TSH: TSH: 2.22 u[IU]/mL (ref 0.35–4.50)

## 2015-04-19 LAB — HIV ANTIBODY (ROUTINE TESTING W REFLEX): HIV 1&2 Ab, 4th Generation: NONREACTIVE

## 2015-04-19 LAB — RPR

## 2015-04-20 ENCOUNTER — Encounter: Payer: Self-pay | Admitting: Family Medicine

## 2015-04-25 ENCOUNTER — Other Ambulatory Visit: Payer: Self-pay | Admitting: Family Medicine

## 2015-04-26 ENCOUNTER — Telehealth: Payer: Self-pay | Admitting: Family Medicine

## 2015-04-26 NOTE — Telephone Encounter (Signed)
Med filled and faxed.  

## 2015-04-26 NOTE — Telephone Encounter (Addendum)
Caller name: Jeremey Relation to pt: Call back number:  [phone] Pharmacy:  Reason for call:   Patient requesting last lab results. Patient is requesting a callback today. Patient was screaming at me to get someone on the phone to tell him what his results are. He wanted me to give him the results but I explained to him that I was not clinical and could not give him the results. I informed him that I am sending the message to the medical assistant and that someone will give him a callback. He demands to be called back today. I informed him that most likely it will be today but I didn't want to guarantee that. He states that if he doesn't get a callback today then he will come up to the office.

## 2015-04-26 NOTE — Telephone Encounter (Signed)
Pt was informed about his results. Pt stated that this will probably be the last time he comes to our office. He advised that he cannot understand what the office states.   He says he had to wait 15 minutes on the phone on hold. Pt states that the office is rude, he likes Dr. Beverely Lowabori a lot but he feels that he is going to another provider due to it being to "difficult to reach anyone with our office, he says he is not sure how we can sleep at night with someone at our front desk saying that HOPEFULLY someone will return your call today" "Obviously we are not to concerned about losing a pt with all the money we make, that our front desk staff can be rude on the phone with patients when they actually answer the phone"

## 2015-04-26 NOTE — Telephone Encounter (Signed)
Last OV 04-18-15 Clonazepam last filled 03-28-15 #60 with 0

## 2015-04-26 NOTE — Telephone Encounter (Signed)
Based on pt's difficulty w/ our office and his desire to find a new practice, we will assist him in finding a new provider if he needs it.  But at this time, we need to dismiss pt due to our fractured relationship

## 2015-04-26 NOTE — Telephone Encounter (Signed)
Paperwork began and given to provider.

## 2015-04-27 ENCOUNTER — Encounter: Payer: Self-pay | Admitting: Family Medicine

## 2015-05-02 ENCOUNTER — Telehealth: Payer: Self-pay | Admitting: Family Medicine

## 2015-05-02 NOTE — Telephone Encounter (Signed)
Patient dismissed from [facility] by Neena RhymesKatherine Tabori MD , effective April 27, 2015. Dismissal letter sent out by certified / registered mail.  DAJ  Received signed domestic return receipt verifying delivery of certified letter on May 08, 2015. Article number [id number]

## 2015-07-10 ENCOUNTER — Encounter: Payer: Self-pay | Admitting: Family Medicine

## 2015-07-10 ENCOUNTER — Ambulatory Visit (INDEPENDENT_AMBULATORY_CARE_PROVIDER_SITE_OTHER): Payer: BLUE CROSS/BLUE SHIELD | Admitting: Family Medicine

## 2015-07-10 ENCOUNTER — Telehealth: Payer: Self-pay | Admitting: Family Medicine

## 2015-07-10 VITALS — BP 118/76 | HR 60 | Ht 66.0 in | Wt 158.2 lb

## 2015-07-10 DIAGNOSIS — Z7189 Other specified counseling: Secondary | ICD-10-CM | POA: Diagnosis not present

## 2015-07-10 DIAGNOSIS — G2581 Restless legs syndrome: Secondary | ICD-10-CM

## 2015-07-10 DIAGNOSIS — Z7689 Persons encountering health services in other specified circumstances: Secondary | ICD-10-CM

## 2015-07-10 NOTE — Telephone Encounter (Signed)
Thank you for the update!

## 2015-07-10 NOTE — Progress Notes (Addendum)
   Subjective:    Patient ID: Perry Rodriguez, male    DOB: September 15, 1961, 54 y.o.   MRN: [id number]  HPI He is here to establish care. He reports taking Clonazapam2 pills for RLS and lately he has been taking it for anxiety.  Sleeping well average 8 hours, eating well.  Reports taking Clonazapam for 15 years for RLS and states he has tried multiple medications.  Wellbutrin for 3-6 months and he was started on this medication by the psychiatrist.  Reports he saw Karl Ito counselor/therapist and a psychiatrist at [facility] 6 months ago. He states he has not benefited from therapy and will not go back to the psychiatrist he last saw. He states he knows more than she does. 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  2 pills every night for sleep and for anxiety on average 3 times per week. He just got divorced and is having to support his son who now lives with his ex.   GERD controlled and only takes medication a couple of days per month.  He is drinking at least 2 drinks per day.    Cialis- has not taken it in months.  Valacyclovir- takes as needed. Aleve once weekly for knee pain.   He states he is mostly concerned with getting his Clonazepam.  Denies fever, chills, recent illness.   Review of Systems '    Objective:   Physical Exam  Constitutional: He is oriented to person, place, and time. He appears well-developed and well-nourished. No distress.  Neurological: He is alert and oriented to person, place, and time. Coordination normal.  Psychiatric: His speech is normal. Thought content normal. He is agitated. Cognition and memory are normal.          Assessment & Plan:  Encounter to establish care  Restless leg syndrome  Recommend that he go back to his psychiatrist who started him on Wellbutrin for anxiety or get the documentation from her sent to this office. He states he "will not ever go back to her" and that he basically told her what medication he wanted  to take. He also states that he is considering stopping the Wellbutrin. When asked what he would like from his patient-provider relationship he stated that he would just like for me to prescribe the medications that he wants. He states he does a lot of research and has tried multiple medications in the past and he knows what works for him. He states "what do I have to do for you to prescribe medication I want". I strongly recommended that he see his therapist again and offered to refer him to a new psychiatrist.    Discussed that his last prescription from Dr. Beverely Low was for #60 Clonazepam with 3 refills so he should have enough medication through August 26 2015. He states he is on his last prescription however he had the bottle with him from August that stated 2 refills remaining. He continued to disagree and states "I feel like I'm having to argue with you". Confronted him that he admitted that he was taking medication more than prescribed. He states "I should have never been honest about that".  Spent at least 45 minutes with 50 percent of that time in counseling and coordination

## 2015-07-10 NOTE — Telephone Encounter (Signed)
Vickie, please see telephone call.

## 2015-07-10 NOTE — Patient Instructions (Addendum)
As discussed, I do not recommend taking Clonazepam for anxiety and restless leg syndrome as it was prescribed for restless leg syndrome only.  I will need documentation from your psychiatrist for continued refills.            You call to schedule your appointment with the psychiatrist. A few offices are listed below for you to call.   [facility] [address], [address], [place], [place] [address]  [phone] - [phone]  [facility] [address] [place], [place] [address]  Phone: [phone]  Charlies Silvers, MD, PA   [address] [place], [place] [address] Phone: [phone]    Restless Legs Syndrome   Restless legs syndrome is a movement disorder. It may also be called a sensorimotor disorder.  CAUSES  No one knows what specifically causes restless legs syndrome, but it tends to run in families. It is also more common in people with low iron, in pregnancy, in people who need dialysis, and those with nerve damage (neuropathy).Some medications may make restless legs syndrome worse.Those medications include drugs to treat high blood pressure, some heart conditions, nausea, colds, allergies, and depression. SYMPTOMS Symptoms include uncomfortable sensations in the legs. These leg sensations are worse during periods of inactivity or rest. They are also worse while sitting or lying down. Individuals that have the disorder describe sensations in the legs that feel like:  Pulling.  Drawing.  Crawling.  Worming.  Boring.  Tingling.  Pins and needles.  Prickling.  Pain. The sensations are usually accompanied by an overwhelming urge to move the legs. Sudden muscle jerks may also occur. Movement provides temporary relief from the discomfort. In rare cases, the arms may also be affected. Symptoms may interfere with going to sleep (sleep onset insomnia). Restless legs syndrome may also be related  to periodic limb movement disorder (PLMD). PLMD is another more common motor disorder. It also causes interrupted sleep. The symptoms from PLMD usually occur most often when you are awake. TREATMENT  Treatment for restless legs syndrome is symptomatic. This means that the symptoms are treated.   Massage and cold compresses may provide temporary relief.  Walk, stretch, or take a cold or hot bath.  Get regular exercise and a good night's sleep.  Avoid caffeine, alcohol, nicotine, and medications that can make it worse.  Do activities that provide mental stimulation like discussions, needlework, and video games. These may be helpful if you are not able to walk or stretch. Some medications are effective in relieving the symptoms. However, many of these medications have side effects. Ask your caregiver about medications that may help your symptoms. Correcting iron deficiency may improve symptoms for some patients. Document Released: 09/20/2002 Document Revised: 02/14/2014 Document Reviewed: 12/27/2010 [facility] Patient Information 2015 [place], [place]. This information is not intended to replace advice given to you by your health care provider. Make sure you discuss any questions you have with your health care provider.

## 2015-07-10 NOTE — Telephone Encounter (Signed)
Pt called and said he didn't want to see vickie henson anymore, stated she stressed him out, said he went to tell her about his knee pain and she said she didn't have time, he was not happy with the visit, said he was not coming back to this office. He just wanted to let us know that he would not be returning.

## 2015-07-11 ENCOUNTER — Telehealth: Payer: Self-pay | Admitting: Family Medicine

## 2015-07-11 NOTE — Telephone Encounter (Signed)
Unfortunately I believe this is against Radiographer, therapeutic.  I will need to check on this prior to giving a final answer

## 2015-07-11 NOTE — Telephone Encounter (Signed)
Relation to ZO:XWRU Call back number:[phone]   Reason for call:  Patient was dismissed and recived a letter Dr. Beverely Low relocating and would like to restablish at new location. Please advise

## 2015-07-12 NOTE — Telephone Encounter (Signed)
Unfortunately, we spoke with management and due to [facility] policy once you have been dismissed you cannot reestablish.

## 2015-07-17 NOTE — Telephone Encounter (Signed)
Notified pt. 

## 2015-11-27 ENCOUNTER — Telehealth: Payer: Self-pay | Admitting: Family Medicine

## 2015-11-27 NOTE — Telephone Encounter (Signed)
Ret call to pt as he had talked with Melissa, regarding bill from Sept and we just rcd payment from [employer] in Jan.   And pt advised her that didn't matter. Pt then complained that NP charged him $355.00  And pt advised her that didn't matter. Pt continued to tell Melissa that NP argued with him the entire visit and when he asked about his knee pain, (which is why he said he came in)  He was told NP didn't have time to address.  Pt advised Melissa that he saw another MD and they advised he file a complaint with insurance company and medical board.  So I called him to see if I could help him.  He advised he didn't feel he should be paying a bill when he didn't get seen for the reason he came in for.  So I looked at what his appt was scheduled for and advised him it was for a medication managment.  He advised he can ask about anything he wants to once he gets her.  I explained we schedule the amount of time based off of what they tell us they want to be seen for when they call.  He states he didn't even get what he wanted.  I explained it appears Vickie was needing records for the medications he was taking.  He said that is beside the point.  He states she was rude to him and then dismissed him and then sent him a bill.  And he was there for his knee.  I tried to explain that he did not mention knee when he made the appointment.  As I tried to explain why we needed the records.  He advised me that I was being argumentative.    He explained if I did not give him a credit, that he would be calling the Medical board.  So I asked him why he felt he needed a credit.  He said " Are you an idiot?"  Have you not heard what I have been telling you!"  I explained she saw him for the visit as he requested.   He advised if he did not get a credit he would file a complain.  I advised him he would not be receiving a credit.  This patient was dismissed in September 2016. 

## 2016-01-16 ENCOUNTER — Encounter: Payer: Self-pay | Admitting: Family Medicine

## 2016-02-05 ENCOUNTER — Telehealth: Payer: Self-pay | Admitting: Family Medicine

## 2016-02-05 ENCOUNTER — Encounter: Payer: Self-pay | Admitting: Family Medicine

## 2016-02-05 NOTE — Telephone Encounter (Signed)
I am also sending patient a letter today to confirm our conversation.

## 2016-02-05 NOTE — Telephone Encounter (Signed)
I agree with your decision.

## 2016-02-05 NOTE — Telephone Encounter (Signed)
Ret call to patient, went over the appointment and protocol.  We had sched him for a 30 min medication management and Harland Dingwall went over his medications and made recommendations to him.  She also recommended he go back to his therapist.  She did mention RLS.  I also asked pt was he being represented by the attorney as I did not respond to his attorney letter as it did not say he was being represented, because if he is we can only speak with the attorney.  He said he was not at that time being represented by the attorney.  The attorney recommended they send the letter and see if this can be cleared up.  I tried to explain to the patient, we spent 45 min with him going over his medication concerns.  He said it would have only taken 5 minutes to tell him we were not going to refill his medication.  I explained with him being a new patient we can't just make a snap decision that Vickie needed to get info from him.  He said Vickie just wanted to argue for 45 minutes.  I expressed I am sure she was just trying to educate him.  He said he was already educated from the computer and he had taken this medication for 20 years.  I explained there wasn't enough time to discuss the knee pain as we did not know when he made the appt that was an issue.  He said it had just started, so he thought since he was at the dr he could ask.  I explained we give timed appointment based on patients complaints and do the best we can.  That there are pt before and after his appt time.  He said he was just lost his job, so he had plenty of time to work on this now and he would see [place] in small claims court unless I was going to remove the charge.  I explained that I was not going to remove the charge.
